# Patient Record
Sex: Male | Born: 1960
Health system: Southern US, Community
[De-identification: ages and names within clinical notes are randomized; demographics above are authoritative.]

## PROBLEM LIST (undated history)

## (undated) DIAGNOSIS — M1990 Unspecified osteoarthritis, unspecified site: Secondary | ICD-10-CM

## (undated) DIAGNOSIS — R0609 Other forms of dyspnea: Principal | ICD-10-CM

## (undated) DIAGNOSIS — I2510 Atherosclerotic heart disease of native coronary artery without angina pectoris: Secondary | ICD-10-CM

## (undated) DIAGNOSIS — E7800 Pure hypercholesterolemia, unspecified: Secondary | ICD-10-CM

## (undated) HISTORY — DX: Other forms of dyspnea: R06.09

## (undated) HISTORY — DX: Pure hypercholesterolemia, unspecified: E78.00

## (undated) HISTORY — DX: Atherosclerotic heart disease of native coronary artery without angina pectoris: I25.10

## (undated) HISTORY — DX: Unspecified osteoarthritis, unspecified site: M19.90

---

## 2001-01-02 ENCOUNTER — Encounter: Payer: Self-pay | Admitting: Family Medicine

## 2001-01-02 ENCOUNTER — Encounter: Admission: RE | Admit: 2001-01-02 | Discharge: 2001-01-02 | Payer: Self-pay | Admitting: Family Medicine

## 2001-01-22 HISTORY — PX: OTHER SURGICAL HISTORY: SHX169

## 2001-01-28 ENCOUNTER — Encounter: Payer: Self-pay | Admitting: Otolaryngology

## 2001-01-28 ENCOUNTER — Encounter: Admission: RE | Admit: 2001-01-28 | Discharge: 2001-01-28 | Payer: Self-pay | Admitting: Otolaryngology

## 2001-02-07 ENCOUNTER — Ambulatory Visit (HOSPITAL_BASED_OUTPATIENT_CLINIC_OR_DEPARTMENT_OTHER): Admission: RE | Admit: 2001-02-07 | Discharge: 2001-02-08 | Payer: Self-pay | Admitting: Otolaryngology

## 2001-02-27 ENCOUNTER — Ambulatory Visit (HOSPITAL_COMMUNITY): Admission: RE | Admit: 2001-02-27 | Discharge: 2001-02-27 | Payer: Self-pay | Admitting: Oncology

## 2001-02-27 ENCOUNTER — Encounter: Payer: Self-pay | Admitting: Oncology

## 2001-02-28 ENCOUNTER — Encounter: Payer: Self-pay | Admitting: Oncology

## 2001-02-28 ENCOUNTER — Ambulatory Visit (HOSPITAL_COMMUNITY): Admission: RE | Admit: 2001-02-28 | Discharge: 2001-02-28 | Payer: Self-pay | Admitting: Oncology

## 2001-06-04 ENCOUNTER — Encounter: Payer: Self-pay | Admitting: Oncology

## 2001-06-04 ENCOUNTER — Ambulatory Visit (HOSPITAL_COMMUNITY): Admission: RE | Admit: 2001-06-04 | Discharge: 2001-06-04 | Payer: Self-pay | Admitting: Oncology

## 2001-10-01 ENCOUNTER — Encounter: Payer: Self-pay | Admitting: Oncology

## 2001-10-01 ENCOUNTER — Ambulatory Visit: Admission: RE | Admit: 2001-10-01 | Discharge: 2001-10-01 | Payer: Self-pay | Admitting: Otolaryngology

## 2002-04-01 ENCOUNTER — Encounter: Payer: Self-pay | Admitting: Oncology

## 2002-04-01 ENCOUNTER — Ambulatory Visit (HOSPITAL_COMMUNITY): Admission: RE | Admit: 2002-04-01 | Discharge: 2002-04-01 | Payer: Self-pay | Admitting: Oncology

## 2002-09-23 ENCOUNTER — Ambulatory Visit (HOSPITAL_COMMUNITY): Admission: RE | Admit: 2002-09-23 | Discharge: 2002-09-23 | Payer: Self-pay | Admitting: Oncology

## 2002-09-23 ENCOUNTER — Encounter: Payer: Self-pay | Admitting: Oncology

## 2003-05-17 ENCOUNTER — Ambulatory Visit (HOSPITAL_COMMUNITY): Admission: RE | Admit: 2003-05-17 | Discharge: 2003-05-17 | Payer: Self-pay | Admitting: Oncology

## 2003-11-10 ENCOUNTER — Ambulatory Visit (HOSPITAL_COMMUNITY): Admission: RE | Admit: 2003-11-10 | Discharge: 2003-11-10 | Payer: Self-pay | Admitting: Oncology

## 2004-05-15 ENCOUNTER — Ambulatory Visit: Payer: Self-pay | Admitting: Oncology

## 2004-05-17 ENCOUNTER — Ambulatory Visit (HOSPITAL_COMMUNITY): Admission: RE | Admit: 2004-05-17 | Discharge: 2004-05-17 | Payer: Self-pay | Admitting: Oncology

## 2004-12-27 ENCOUNTER — Ambulatory Visit: Payer: Self-pay | Admitting: Oncology

## 2004-12-28 ENCOUNTER — Ambulatory Visit (HOSPITAL_COMMUNITY): Admission: RE | Admit: 2004-12-28 | Discharge: 2004-12-28 | Payer: Self-pay | Admitting: Oncology

## 2005-06-19 ENCOUNTER — Observation Stay (HOSPITAL_COMMUNITY): Admission: EM | Admit: 2005-06-19 | Discharge: 2005-06-19 | Payer: Self-pay | Admitting: Emergency Medicine

## 2005-12-17 ENCOUNTER — Ambulatory Visit: Payer: Self-pay | Admitting: Oncology

## 2005-12-20 LAB — CBC WITH DIFFERENTIAL/PLATELET
Eosinophils Absolute: 0.1 10*3/uL (ref 0.0–0.5)
HCT: 42.7 % (ref 38.7–49.9)
LYMPH%: 42.1 % (ref 14.0–48.0)
MCHC: 34.8 g/dL (ref 32.0–35.9)
MCV: 84.1 fL (ref 81.6–98.0)
MONO#: 0.4 10*3/uL (ref 0.1–0.9)
MONO%: 9.4 % (ref 0.0–13.0)
NEUT%: 46.5 % (ref 40.0–75.0)
Platelets: 169 10*3/uL (ref 145–400)
RBC: 5.07 10*6/uL (ref 4.20–5.71)
WBC: 4.7 10*3/uL (ref 4.0–10.0)

## 2005-12-20 LAB — COMPREHENSIVE METABOLIC PANEL
Alkaline Phosphatase: 45 U/L (ref 39–117)
CO2: 25 mEq/L (ref 19–32)
Creatinine, Ser: 1.05 mg/dL (ref 0.40–1.50)
Glucose, Bld: 92 mg/dL (ref 70–99)
Sodium: 139 mEq/L (ref 135–145)
Total Bilirubin: 0.8 mg/dL (ref 0.3–1.2)

## 2005-12-20 LAB — LACTATE DEHYDROGENASE: LDH: 121 U/L (ref 94–250)

## 2005-12-21 ENCOUNTER — Ambulatory Visit (HOSPITAL_COMMUNITY): Admission: RE | Admit: 2005-12-21 | Discharge: 2005-12-21 | Payer: Self-pay | Admitting: Oncology

## 2006-01-22 HISTORY — PX: CHOLECYSTECTOMY: SHX55

## 2010-02-11 ENCOUNTER — Encounter: Payer: Self-pay | Admitting: Oncology

## 2012-11-04 ENCOUNTER — Encounter (INDEPENDENT_AMBULATORY_CARE_PROVIDER_SITE_OTHER): Payer: Self-pay | Admitting: General Surgery

## 2012-11-04 ENCOUNTER — Encounter (INDEPENDENT_AMBULATORY_CARE_PROVIDER_SITE_OTHER): Payer: Self-pay

## 2012-11-04 ENCOUNTER — Other Ambulatory Visit (INDEPENDENT_AMBULATORY_CARE_PROVIDER_SITE_OTHER): Payer: Self-pay | Admitting: General Surgery

## 2012-11-04 ENCOUNTER — Ambulatory Visit (INDEPENDENT_AMBULATORY_CARE_PROVIDER_SITE_OTHER): Payer: BC Managed Care – PPO | Admitting: General Surgery

## 2012-11-04 VITALS — BP 124/82 | HR 77 | Temp 97.7°F | Resp 16 | Ht 71.0 in | Wt 235.4 lb

## 2012-11-04 DIAGNOSIS — L02419 Cutaneous abscess of limb, unspecified: Secondary | ICD-10-CM | POA: Insufficient documentation

## 2012-11-04 DIAGNOSIS — IMO0002 Reserved for concepts with insufficient information to code with codable children: Secondary | ICD-10-CM

## 2012-11-04 MED ORDER — HYDROCODONE-ACETAMINOPHEN 5-325 MG PO TABS: 1.0000 | ORAL_TABLET | Freq: Four times a day (QID) | ORAL | Status: DC | PRN

## 2012-11-04 NOTE — Patient Instructions (Signed)
Pull out packing in 48 hours.    Shower with warm soapy water running over it.    Pack daily for 1 week after packing comes out.    Follow up in 2-3 weeks.

## 2012-11-05 ENCOUNTER — Telehealth (INDEPENDENT_AMBULATORY_CARE_PROVIDER_SITE_OTHER): Payer: Self-pay

## 2012-11-05 ENCOUNTER — Telehealth (INDEPENDENT_AMBULATORY_CARE_PROVIDER_SITE_OTHER): Payer: Self-pay | Admitting: *Deleted

## 2012-11-05 ENCOUNTER — Other Ambulatory Visit (INDEPENDENT_AMBULATORY_CARE_PROVIDER_SITE_OTHER): Payer: Self-pay | Admitting: *Deleted

## 2012-11-05 MED ORDER — ONDANSETRON 4 MG PO TBDP: 4.0000 mg | ORAL_TABLET | Freq: Four times a day (QID) | ORAL | Status: DC | PRN

## 2012-11-05 NOTE — Progress Notes (Signed)
Patient ID: Garrett Keller, male   DOB: 03/31/1960, 52 y.o.   MRN: [id number]  Chief Complaint  Patient presents with  . Other    urge office/poss spider bite under Rt axilla    HPI Garrett Keller is a 52 y.o. male.   HPI Patient is a 52 year old male who presents with pain, redness, and swelling in her right axilla. He is concerned that he might have gotten a spider bite or some other form of insect bite. He denied ever and nose a insect or arthropod on him, but he does not recall any other incident of trauma. This has been going on for approximately 3 days. It first started hurting and then proceeded to become very red and swollen. It has gotten to where it hurts him to move his arm.  He denies fevers and chills.   Past Medical History  Diagnosis Date  . Arthritis     Past Surgical History  Procedure Laterality Date  . Cholecystectomy  2008  . 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 No pertinent family history.  Social History History  Substance Use Topics  . Smoking status: Never Smoker   . Smokeless tobacco: Not on file  . Alcohol Use: Yes     Comment: weekeneds    No Known Allergies  Current Outpatient Prescriptions  Medication Sig Dispense Refill  . HYDROcodone-acetaminophen (NORCO/VICODIN) 5-325 MG per tablet       . omeprazole (PRILOSEC) 20 MG capsule       . sulfamethoxazole-trimethoprim (BACTRIM DS) 800-160 MG per tablet       . HYDROcodone-acetaminophen (NORCO) 5-325 MG per tablet Take 1-2 tablets by mouth every 6 (six) hours as needed for pain.  20 tablet  0  . LORazepam (ATIVAN) 0.5 MG tablet       . ondansetron (ZOFRAN ODT) 4 MG disintegrating tablet Take 1 tablet (4 mg total) by mouth every 6 (six) hours as needed for nausea.  15 tablet  0   No current facility-administered medications for this visit.    Review of Systems Review of Systems  Blood pressure 124/82, pulse 77, temperature 97.7 F (36.5 C), temperature  source Temporal, resp. rate 16, height 5\' 11"  (1.803 m), weight 235 lb 6.4 oz (106.777 kg), SpO2 98.00%.  Physical Exam Physical Exam  Constitutional: He is oriented to person, place, and time. He appears well-developed and well-nourished. No distress.  HENT:  Head: Normocephalic and atraumatic.  Eyes: Conjunctivae are normal. Pupils are equal, round, and reactive to light. No scleral icterus.  Neck: Normal range of motion.  Cardiovascular: Normal rate and regular rhythm.   Pulmonary/Chest: Effort normal. No respiratory distress.  Abdominal: Soft. He exhibits no distension. There is no tenderness.  Neurological: He is alert and oriented to person, place, and time.  Skin: Skin is warm and dry. No rash noted. He is not diaphoretic. No erythema. No pallor.  Right axillary abscess posterior axillary line.    Psychiatric: He has a normal mood and affect. His behavior is normal. Judgment and thought content normal.    Data Reviewed No results found. n/a  Assessment/plan    Axillary abscess The patient had an axillary abscess of unknown etiology. An incision and drainage was performed at the bedside. He is instructed to remove packing in 48 hours and then we will set up home health to teach his wife how to pack it for an additional week.  I  will see him back in approximately 2 weeks.          Garrett Keller 11/05/2012, 9:42 AM

## 2012-11-05 NOTE — Telephone Encounter (Signed)
Patient has called back again to report that there is still a lump at the wound site.  Explained that this is probably just the packing which needs to stay in 48 hours however patient disagrees and thinks it needs to be drained more.  Spoke to Dr. Donell Beers and Cyndra Numbers who asked that appt for patient be made in urgent office tomorrow where packing can be removed and wound reassessed.  Patient is agreeable at this time and states understanding.

## 2012-11-05 NOTE — Telephone Encounter (Signed)
Patient called in this morning reporting episodes of vomiting and nausea.  Patient states he feels like it is the pain medication causing his stomach to be upset.  Per protocol Zofran 4mg  ODT Take 1 tablet every 6 hours as needed for nausea #15 no refills escribed to patient's pharmacy and confirmation received.  Explained to patient that if this doesn't help then to give [place] a call back and we can move to the Phenergan.  Patient states understanding and agreeable at this time.

## 2012-11-05 NOTE — Assessment & Plan Note (Signed)
The patient had an axillary abscess of unknown etiology. An incision and drainage was performed at the bedside. He is instructed to remove packing in 48 hours and then we will set up home health to teach his wife how to pack it for an additional week.  I will see him back in approximately 2 weeks.

## 2012-11-05 NOTE — Telephone Encounter (Signed)
Pt called stating [facility] called and advised pt he has not met his insurance deductable. [facility] will charge 150$ to come to home to teach pt wd care. Pt requests ov here to have wd care teaching for wife. Pt advised I will send msg to [facility] and Dr Donell Beers to see if order can be changed for office wd care. Pt advised office will return his call.

## 2012-11-06 ENCOUNTER — Ambulatory Visit (INDEPENDENT_AMBULATORY_CARE_PROVIDER_SITE_OTHER): Payer: BC Managed Care – PPO | Admitting: General Surgery

## 2012-11-06 ENCOUNTER — Encounter (INDEPENDENT_AMBULATORY_CARE_PROVIDER_SITE_OTHER): Payer: Self-pay

## 2012-11-06 ENCOUNTER — Encounter (INDEPENDENT_AMBULATORY_CARE_PROVIDER_SITE_OTHER): Payer: Self-pay | Admitting: General Surgery

## 2012-11-06 VITALS — BP 120/78 | HR 80 | Resp 18 | Ht 71.0 in | Wt 233.0 lb

## 2012-11-06 DIAGNOSIS — IMO0002 Reserved for concepts with insufficient information to code with codable children: Secondary | ICD-10-CM

## 2012-11-06 DIAGNOSIS — L02419 Cutaneous abscess of limb, unspecified: Secondary | ICD-10-CM

## 2012-11-06 MED ORDER — OXYCODONE HCL 5 MG PO TABS: 5.0000 mg | ORAL_TABLET | ORAL | Status: DC | PRN

## 2012-11-06 NOTE — Patient Instructions (Addendum)
Shower before your appointment tomorrow. Take 2 pain pills one hour before your appointment tomorrow.  No heavy activity with right arm for 10 days.

## 2012-11-06 NOTE — Progress Notes (Signed)
Subjective:     Patient ID: ROCKLAND KOTARSKI, male   DOB: Feb 13, 1960, 52 y.o.   MRN: [id number]  HPI  He had an incision and drainage of a posterior right axillary abscess yesterday. He tried to remove the packing in the shower today but said he was in exquisite pain and could not do it. He states he's not feeling significantly better. He has been taking the Bactrim. He states there is an area extending around the back which has not changed and is painful. He's trying to take hydrocodone but this is not helping with the pain.   Review of SystemsNo fever or chills.     Objective:   Physical Exam Gen.-he is in no acute distress.  Right axilla-packing was removed and there is an open wound. Extending around the back however is an area of erythema and induration. I anesthetized the wound before removing the packing and an anesthetized the area tracking around the back. I then extended the incision and discovered another pocket of infection which was loculated and I broke this up. I excised in elliptical-shaped wedge of skin and debrided some necrotic tissue. I then repacked with saline moistened gauze followed by dry dressing.    Assessment:     Complex right axillary abscess. More extensive incision and drainage was done today.    Plan:     Change pain medication to oxycodone. Return visit tomorrow for dressing change and wound check.

## 2012-11-07 ENCOUNTER — Ambulatory Visit (INDEPENDENT_AMBULATORY_CARE_PROVIDER_SITE_OTHER): Payer: BC Managed Care – PPO | Admitting: General Surgery

## 2012-11-07 ENCOUNTER — Encounter (INDEPENDENT_AMBULATORY_CARE_PROVIDER_SITE_OTHER): Payer: Self-pay | Admitting: General Surgery

## 2012-11-07 VITALS — BP 122/78 | HR 76 | Temp 97.0°F | Resp 15 | Ht 71.0 in | Wt 232.8 lb

## 2012-11-07 DIAGNOSIS — Z4889 Encounter for other specified surgical aftercare: Secondary | ICD-10-CM

## 2012-11-07 LAB — WOUND CULTURE
Gram Stain: NONE SEEN
Gram Stain: NONE SEEN

## 2012-11-07 NOTE — Progress Notes (Signed)
He is here for wound check and dressing change. He feels a lot better and slept well last night.  He is afebrile. The packing was removed through the right axillary wound is clean. It was repacked.  Will bring him back on Monday for a nurse dressing change. He was instructed to remove the bandage on Sunday in place a dry gauze over it. The culture is MRSA positive and we discussed what this means

## 2012-11-07 NOTE — Patient Instructions (Signed)
You have a MRSA infection.  You're on the appropriate antibiotic for this.  Clean hands frequently with hand sanitizer.  Remove packing Sunday afternoon or evening and apply a dry dressing to the area. Come for a dressing change on Monday.

## 2012-11-10 ENCOUNTER — Ambulatory Visit (INDEPENDENT_AMBULATORY_CARE_PROVIDER_SITE_OTHER): Payer: BC Managed Care – PPO

## 2012-11-10 VITALS — BP 122/80 | HR 86 | Temp 98.1°F | Resp 18 | Ht 71.0 in | Wt 232.0 lb

## 2012-11-10 DIAGNOSIS — Z4800 Encounter for change or removal of nonsurgical wound dressing: Secondary | ICD-10-CM

## 2012-11-10 NOTE — Patient Instructions (Signed)
You may shower tomorrow AM and remove packing before your appt with our office.

## 2012-11-10 NOTE — Progress Notes (Signed)
Patient came in today for repacking of his right axilla wound.  Pt denies any fever chills.  Did complain of some itching which I stated was part of the healing process.  Wound looked good, beefy red tissue with a minimal amount of purulent drainage, with a slight odor.  Pt states the pain is getting better and he has been taking his prescribed pain medication.  I cleansed wound with NS and then repacking with a NS gauze and covered with a dry gauze.  Paper tape for sensitive skin that pt brought from home was then applied.  Pt did have several blister like areas as well as a few scabs where medipore tape was previous applied and he had a reaction to it.

## 2012-11-11 ENCOUNTER — Telehealth (INDEPENDENT_AMBULATORY_CARE_PROVIDER_SITE_OTHER): Payer: Self-pay | Admitting: *Deleted

## 2012-11-11 ENCOUNTER — Ambulatory Visit (INDEPENDENT_AMBULATORY_CARE_PROVIDER_SITE_OTHER): Payer: BC Managed Care – PPO

## 2012-11-11 DIAGNOSIS — Z4800 Encounter for change or removal of nonsurgical wound dressing: Secondary | ICD-10-CM

## 2012-11-11 NOTE — Telephone Encounter (Signed)
Patient called to ask about having another wound culture done to see if the infection has cleared up following antibiotic treatment.  Explained that we don't usually do a wound culture on the end of the antibiotics unless there is a reason to do so.  Explained that I would send a message however to ask then we can let the patient know.  Patient states understanding.

## 2012-11-11 NOTE — Progress Notes (Signed)
Pt comes in today for repacking/dressing change 7 days s/p right axillary abscess.  I removed the existing packing and examined the wound.  The wound looks good with good grandular tissue.  I then repacked with wet to dry gauze.  The pt had blisters about 3-4 inches from the wound which he states came from the cloth tape.  Pt brought his own tape, and I secured the dry gauze with that.

## 2012-11-11 NOTE — Telephone Encounter (Signed)
Repeat culture not needed.

## 2012-11-12 ENCOUNTER — Encounter (INDEPENDENT_AMBULATORY_CARE_PROVIDER_SITE_OTHER): Payer: Self-pay

## 2012-11-12 ENCOUNTER — Ambulatory Visit (INDEPENDENT_AMBULATORY_CARE_PROVIDER_SITE_OTHER): Payer: BC Managed Care – PPO | Admitting: General Surgery

## 2012-11-12 VITALS — BP 162/98 | HR 76 | Temp 97.2°F | Resp 16 | Ht 71.0 in | Wt 232.0 lb

## 2012-11-12 DIAGNOSIS — T148XXA Other injury of unspecified body region, initial encounter: Secondary | ICD-10-CM

## 2012-11-12 NOTE — Progress Notes (Signed)
Patient comes in today for a packing change s/p right axillary wound.  The wound is still large in length however once the packing was removed the wound had good granulation tissue with no drainage.  The wound seems to be getting shallower and less sensitive for the patient.  He has no c/o redness, fevers, chills, N/V.  He is still taking the percocet as needed but has started to limit the amount used.  I irrigated the wound with normal Saline and then repacked the wound with a sterile wet to dry 4x4 gauze.  The patient tolerated this well.  It was then covered with sterile gauze and taped using paper tape that the patient brought in.  He has a nurse only appt tomorrow.

## 2012-11-13 ENCOUNTER — Ambulatory Visit (INDEPENDENT_AMBULATORY_CARE_PROVIDER_SITE_OTHER): Payer: BC Managed Care – PPO

## 2012-11-13 DIAGNOSIS — Z4801 Encounter for change or removal of surgical wound dressing: Secondary | ICD-10-CM

## 2012-11-13 NOTE — Progress Notes (Signed)
Pt is here today for wound dressing change.  Wound is healing nicely with good granulation tissue.  Pt pulled the packing out before he came for his appointment.  Wound repacked with damp saline gauze and covered with dry 4 x 4 dressing and paper tape.  The patient would like to know if he can go back to work on Monday.  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  Will ask Dr. Abbey Chatters if pt can return to work and let him know at his appointment tomorrow morning at 8:30a.m.

## 2012-11-14 ENCOUNTER — Ambulatory Visit (INDEPENDENT_AMBULATORY_CARE_PROVIDER_SITE_OTHER): Payer: BC Managed Care – PPO

## 2012-11-14 ENCOUNTER — Encounter (INDEPENDENT_AMBULATORY_CARE_PROVIDER_SITE_OTHER): Payer: Self-pay

## 2012-11-14 DIAGNOSIS — L02419 Cutaneous abscess of limb, unspecified: Secondary | ICD-10-CM

## 2012-11-14 DIAGNOSIS — IMO0002 Reserved for concepts with insufficient information to code with codable children: Secondary | ICD-10-CM

## 2012-11-14 NOTE — Progress Notes (Signed)
Pt came in for nurse only to have dressing change to axillary wound. The pt had already removed the packing this a.m. Before coming to the appt and he removed the bandage in the room. I cleaned the wound with normal saline and a q-tip before repacking the wound with a 2x2 gauze wet to dry with normal saline. I recovered the area with 4x4 gauze and tape. The pt will need to return on Monday for a nurse visit per Dr Abbey Chatters.

## 2012-11-17 ENCOUNTER — Ambulatory Visit (INDEPENDENT_AMBULATORY_CARE_PROVIDER_SITE_OTHER): Payer: BC Managed Care – PPO

## 2012-11-17 VITALS — BP 124/84 | Temp 97.4°F | Resp 16 | Ht 71.0 in | Wt 234.6 lb

## 2012-11-17 DIAGNOSIS — Z4801 Encounter for change or removal of surgical wound dressing: Secondary | ICD-10-CM

## 2012-11-17 NOTE — Progress Notes (Signed)
Patient comes to office today for right axilla-wound check and packing.  Gauze was removed from  open axilla wound. I then repacked with saline moistened gauze followed by dry dressing. Patient tolerated well.  Wound appears to be healing well.  Patient denies having any fevers.  Patient reports continuous pain in the right axilla.  Skin around open wound has some mild irritation that patient feel's comes from the tape he was previously applying.  Patient has changed to sensitive skin tape.  Patient has nurse visit for tomorrow @ 3:00pm for dressing change and wound repacking.

## 2012-11-18 ENCOUNTER — Encounter (INDEPENDENT_AMBULATORY_CARE_PROVIDER_SITE_OTHER): Payer: Self-pay | Admitting: General Surgery

## 2012-11-18 ENCOUNTER — Ambulatory Visit (INDEPENDENT_AMBULATORY_CARE_PROVIDER_SITE_OTHER): Payer: BC Managed Care – PPO | Admitting: General Surgery

## 2012-11-18 VITALS — BP 122/81 | HR 66 | Temp 97.0°F | Resp 14 | Ht 71.0 in | Wt 233.6 lb

## 2012-11-18 DIAGNOSIS — Z5189 Encounter for other specified aftercare: Secondary | ICD-10-CM

## 2012-11-18 NOTE — Patient Instructions (Signed)
Patient came in for dressing change. I placed a wet 2 x 2 gauze inside the wound and placed a dry gauze on top of the wound. I told the patient that he can take a shower and let the water and soap over the wound and he can put another wet gauze in the wound and place a dry gauze on top. I told him that he will need not to put some much tape on the gauze and on his skin, the tape is eating up his skin. I made a apt for the patient to come back tomorrow for a nurse only at 3

## 2012-11-19 ENCOUNTER — Ambulatory Visit (INDEPENDENT_AMBULATORY_CARE_PROVIDER_SITE_OTHER): Payer: BC Managed Care – PPO

## 2012-11-19 DIAGNOSIS — Z4801 Encounter for change or removal of surgical wound dressing: Secondary | ICD-10-CM

## 2012-11-19 NOTE — Progress Notes (Signed)
Pt came in for nurse only to have packing changed. The packing was removed already by the pt before coming to the office today. The wound was repacked with a 2x2 gauze wet to dry with gauze placed over top of the area. The pt tolerated this well. The pt will come back tomorrow for nurse only visit.

## 2012-11-20 ENCOUNTER — Ambulatory Visit (INDEPENDENT_AMBULATORY_CARE_PROVIDER_SITE_OTHER): Payer: BC Managed Care – PPO | Admitting: General Surgery

## 2012-11-20 ENCOUNTER — Encounter (INDEPENDENT_AMBULATORY_CARE_PROVIDER_SITE_OTHER): Payer: Self-pay | Admitting: General Surgery

## 2012-11-20 VITALS — BP 122/81 | HR 76 | Temp 97.0°F | Resp 15 | Ht 71.0 in | Wt 232.0 lb

## 2012-11-20 DIAGNOSIS — Z4800 Encounter for change or removal of nonsurgical wound dressing: Secondary | ICD-10-CM

## 2012-11-20 NOTE — Patient Instructions (Signed)
Patient came in today for a nurse only visit for reck of axilla wound. I looked at the wound and the axilla wound is closing up well. I placed a wet 2 x 2 gauze inside the wound and place a dry guaze over the wound and placed paper tape over the wound. I told the patient that he could stand in front of a mirror and placed a wet 2 x 2 gauze inside the wound and placed a dry gauze on top that he did not need to come to the office for nurse visit that the wound was almost healed, patient agree to do that put I made him aware that he will need to keep his apt with Dr Abbey Chatters next week 11-27-2012. The patient want a Rx for pain med. Oxycodone 5 mg 1 tab po q 6 hrs prn for pain. I got Dr Ezzard Standing to write it for me, he was the urgent office doctor today.

## 2012-11-24 ENCOUNTER — Encounter (INDEPENDENT_AMBULATORY_CARE_PROVIDER_SITE_OTHER): Payer: BC Managed Care – PPO | Admitting: General Surgery

## 2012-11-27 ENCOUNTER — Encounter (INDEPENDENT_AMBULATORY_CARE_PROVIDER_SITE_OTHER): Payer: Self-pay | Admitting: General Surgery

## 2012-11-27 ENCOUNTER — Ambulatory Visit (INDEPENDENT_AMBULATORY_CARE_PROVIDER_SITE_OTHER): Payer: BC Managed Care – PPO | Admitting: General Surgery

## 2012-11-27 VITALS — BP 120/90 | HR 76 | Temp 97.3°F | Resp 16 | Ht 71.0 in | Wt 231.0 lb

## 2012-11-27 DIAGNOSIS — IMO0002 Reserved for concepts with insufficient information to code with codable children: Secondary | ICD-10-CM

## 2012-11-27 DIAGNOSIS — L02419 Cutaneous abscess of limb, unspecified: Secondary | ICD-10-CM

## 2012-11-27 NOTE — Patient Instructions (Signed)
Continue current dressing changes

## 2012-11-27 NOTE — Progress Notes (Signed)
Subjective:     Patient ID: Garrett Keller, male   DOB: 19-Nov-1960, 52 y.o.   MRN: [id number]  HPI  He is here for a wound check following complex incision and drainage of a right axillary abscess. He is doing his own dressing changes. These are wet to dry dressing changes. He is back at work.   Review of Systems     Objective:   Physical Exam Right axilla-wound is clean and healing in. Good granulation tissue is present. No purulence. No erythema.    Assessment:     Abscess wound healing well by secondary intention and right axilla.     Plan:     Continue current wound care. Return visit in 4 weeks.

## 2012-12-25 ENCOUNTER — Encounter (INDEPENDENT_AMBULATORY_CARE_PROVIDER_SITE_OTHER): Payer: BC Managed Care – PPO | Admitting: General Surgery

## 2012-12-30 ENCOUNTER — Encounter (INDEPENDENT_AMBULATORY_CARE_PROVIDER_SITE_OTHER): Payer: Self-pay

## 2012-12-30 ENCOUNTER — Ambulatory Visit (INDEPENDENT_AMBULATORY_CARE_PROVIDER_SITE_OTHER): Payer: BC Managed Care – PPO | Admitting: General Surgery

## 2012-12-30 VITALS — BP 136/70 | HR 64 | Temp 98.0°F | Resp 18 | Ht 69.0 in | Wt 235.0 lb

## 2012-12-30 DIAGNOSIS — L02419 Cutaneous abscess of limb, unspecified: Secondary | ICD-10-CM

## 2012-12-30 DIAGNOSIS — IMO0002 Reserved for concepts with insufficient information to code with codable children: Secondary | ICD-10-CM

## 2012-12-30 NOTE — Patient Instructions (Signed)
Buy Hibiclens soap and wash your body with it in the shower daily for one week.  Have any close contacts do the same.  Wash clothes in hot water.  Spray furniture with anti-Staph spray.

## 2012-12-30 NOTE — Progress Notes (Signed)
Subjective:     Patient ID: Garrett Keller, male   DOB: [birth year], 52 y.o.   MRN: [id number]  HPI  He is here for a wound check following complex incision and drainage of a right axillary abscess.   His wound has healed and he is having no pain.  Review of Systems     Objective:   Physical Exam Right axilla-wound is healed.    Assessment:     MRSA right axillary abscess status post incision and drainage-wound has healed by secondary intention.    Plan:     Hibiclens body wash daily for one week in the shower. Clean clothes with hot water. Spray furniture with anti-staph spray. Return visit when necessary.

## 2013-11-23 ENCOUNTER — Other Ambulatory Visit: Payer: Self-pay

## 2014-01-28 ENCOUNTER — Other Ambulatory Visit: Payer: Self-pay | Admitting: Dermatology

## 2015-03-14 DIAGNOSIS — N401 Enlarged prostate with lower urinary tract symptoms: Secondary | ICD-10-CM | POA: Insufficient documentation

## 2015-03-14 DIAGNOSIS — R972 Elevated prostate specific antigen [PSA]: Secondary | ICD-10-CM | POA: Insufficient documentation

## 2015-10-10 DIAGNOSIS — R454 Irritability and anger: Secondary | ICD-10-CM | POA: Diagnosis not present

## 2015-11-21 DIAGNOSIS — M545 Low back pain: Secondary | ICD-10-CM | POA: Diagnosis not present

## 2015-11-21 DIAGNOSIS — K219 Gastro-esophageal reflux disease without esophagitis: Secondary | ICD-10-CM | POA: Diagnosis not present

## 2015-11-21 DIAGNOSIS — F329 Major depressive disorder, single episode, unspecified: Secondary | ICD-10-CM | POA: Diagnosis not present

## 2015-11-29 DIAGNOSIS — K119 Disease of salivary gland, unspecified: Secondary | ICD-10-CM | POA: Diagnosis not present

## 2015-12-23 DIAGNOSIS — K118 Other diseases of salivary glands: Secondary | ICD-10-CM | POA: Insufficient documentation

## 2015-12-23 DIAGNOSIS — K119 Disease of salivary gland, unspecified: Secondary | ICD-10-CM | POA: Diagnosis not present

## 2016-02-01 ENCOUNTER — Other Ambulatory Visit: Payer: Self-pay | Admitting: Otolaryngology

## 2016-02-01 DIAGNOSIS — D37030 Neoplasm of uncertain behavior of the parotid salivary glands: Secondary | ICD-10-CM | POA: Diagnosis not present

## 2016-02-01 DIAGNOSIS — C8581 Other specified types of non-Hodgkin lymphoma, lymph nodes of head, face, and neck: Secondary | ICD-10-CM | POA: Diagnosis not present

## 2016-02-01 DIAGNOSIS — C884 Extranodal marginal zone B-cell lymphoma of mucosa-associated lymphoid tissue [MALT-lymphoma]: Secondary | ICD-10-CM | POA: Diagnosis not present

## 2016-02-01 DIAGNOSIS — D72820 Lymphocytosis (symptomatic): Secondary | ICD-10-CM | POA: Diagnosis not present

## 2016-02-01 DIAGNOSIS — K119 Disease of salivary gland, unspecified: Secondary | ICD-10-CM | POA: Diagnosis not present

## 2016-02-02 ENCOUNTER — Other Ambulatory Visit (HOSPITAL_COMMUNITY)
Admission: RE | Admit: 2016-02-02 | Disposition: A | Discharge status: Home / Self Care | Payer: Self-pay | Source: Ambulatory Visit | Attending: Otolaryngology | Admitting: Otolaryngology

## 2016-02-13 DIAGNOSIS — C884 Extranodal marginal zone B-cell lymphoma of mucosa-associated lymphoid tissue [MALT-lymphoma]: Secondary | ICD-10-CM | POA: Insufficient documentation

## 2016-02-17 ENCOUNTER — Other Ambulatory Visit (HOSPITAL_COMMUNITY): Payer: Self-pay | Admitting: Otolaryngology

## 2016-02-17 DIAGNOSIS — C884 Extranodal marginal zone B-cell lymphoma of mucosa-associated lymphoid tissue [MALT-lymphoma]: Secondary | ICD-10-CM

## 2016-02-24 ENCOUNTER — Encounter (HOSPITAL_COMMUNITY)
Admission: RE | Admit: 2016-02-24 | Discharge: 2016-02-24 | Disposition: A | Discharge status: Home / Self Care | Payer: BLUE CROSS/BLUE SHIELD | Source: Ambulatory Visit | Attending: Otolaryngology | Admitting: Otolaryngology

## 2016-02-24 DIAGNOSIS — C884 Extranodal marginal zone B-cell lymphoma of mucosa-associated lymphoid tissue [MALT-lymphoma]: Secondary | ICD-10-CM | POA: Insufficient documentation

## 2016-02-24 LAB — GLUCOSE, CAPILLARY: Glucose-Capillary: 105 mg/dL — ABNORMAL HIGH (ref 65–99)

## 2016-02-24 MED ORDER — FLUDEOXYGLUCOSE F - 18 (FDG) INJECTION
11.4200 | Freq: Once | INTRAVENOUS | Status: AC | PRN
  Administered 2016-02-24: 11.42 via INTRAVENOUS

## 2016-03-01 ENCOUNTER — Other Ambulatory Visit: Payer: Self-pay

## 2016-03-01 DIAGNOSIS — D492 Neoplasm of unspecified behavior of bone, soft tissue, and skin: Secondary | ICD-10-CM | POA: Diagnosis not present

## 2016-03-01 DIAGNOSIS — D485 Neoplasm of uncertain behavior of skin: Secondary | ICD-10-CM | POA: Diagnosis not present

## 2016-03-05 ENCOUNTER — Telehealth: Payer: Self-pay | Admitting: Hematology

## 2016-03-05 NOTE — Telephone Encounter (Signed)
Appt has been scheduled for the pt to see Dr. Irene Limbo on 2/16 at 1230pm. Aware to arrive 30 minutes early. Pt agreed to the appt date and time. Demographics verified.

## 2016-03-09 ENCOUNTER — Telehealth: Payer: Self-pay | Admitting: Hematology

## 2016-03-09 ENCOUNTER — Ambulatory Visit (HOSPITAL_BASED_OUTPATIENT_CLINIC_OR_DEPARTMENT_OTHER): Payer: BLUE CROSS/BLUE SHIELD | Admitting: Hematology

## 2016-03-09 ENCOUNTER — Encounter: Payer: Self-pay | Admitting: Hematology

## 2016-03-09 ENCOUNTER — Ambulatory Visit (HOSPITAL_BASED_OUTPATIENT_CLINIC_OR_DEPARTMENT_OTHER): Payer: BLUE CROSS/BLUE SHIELD

## 2016-03-09 VITALS — BP 154/81 | HR 70 | Temp 98.3°F | Resp 18 | Ht 70.0 in | Wt 235.4 lb

## 2016-03-09 DIAGNOSIS — C884 Extranodal marginal zone B-cell lymphoma of mucosa-associated lymphoid tissue [MALT-lymphoma]: Secondary | ICD-10-CM | POA: Diagnosis not present

## 2016-03-09 LAB — COMPREHENSIVE METABOLIC PANEL
ALT: 35 U/L (ref 0–55)
AST: 21 U/L (ref 5–34)
Albumin: 4 g/dL (ref 3.5–5.0)
Alkaline Phosphatase: 56 U/L (ref 40–150)
Anion Gap: 10 mEq/L (ref 3–11)
BUN: 17.2 mg/dL (ref 7.0–26.0)
CO2: 22 mEq/L (ref 22–29)
Calcium: 9.5 mg/dL (ref 8.4–10.4)
Chloride: 109 mEq/L (ref 98–109)
Creatinine: 0.9 mg/dL (ref 0.7–1.3)
EGFR: >90 mL/min/{1.73_m2} (ref >90)
GLUCOSE: 99 mg/dL (ref 70–140)
POTASSIUM: 3.6 meq/L (ref 3.5–5.1)
Sodium: 141 mEq/L (ref 136–145)
Total Bilirubin: 0.66 mg/dL (ref 0.20–1.20)
Total Protein: 8.1 g/dL (ref 6.4–8.3)

## 2016-03-09 LAB — CBC & DIFF AND RETIC
BASO%: 0.2 % (ref 0.0–2.0)
Basophils Absolute: 0 10*3/uL (ref 0.0–0.1)
EOS ABS: 0.1 10*3/uL (ref 0.0–0.5)
EOS%: 2.1 % (ref 0.0–7.0)
HCT: 42.3 % (ref 38.4–49.9)
HGB: 14.8 g/dL (ref 13.0–17.1)
Immature Retic Fract: 2.2 % — ABNORMAL LOW (ref 3.00–10.60)
LYMPH%: 42.1 % (ref 14.0–49.0)
MCH: 30 pg (ref 27.2–33.4)
MCHC: 35 g/dL (ref 32.0–36.0)
MCV: 85.6 fL (ref 79.3–98.0)
MONO#: 0.4 10*3/uL (ref 0.1–0.9)
MONO%: 8.6 % (ref 0.0–14.0)
NEUT%: 47 % (ref 39.0–75.0)
NEUTROS ABS: 2 10*3/uL (ref 1.5–6.5)
Platelets: 150 10*3/uL (ref 140–400)
RBC: 4.94 10*6/uL (ref 4.20–5.82)
RDW: 13.3 % (ref 11.0–14.6)
RETIC %: 1.04 % (ref 0.80–1.80)
Retic Ct Abs: 51.38 10*3/uL (ref 34.80–93.90)
WBC: 4.2 10*3/uL (ref 4.0–10.3)
lymph#: 1.8 10*3/uL (ref 0.9–3.3)

## 2016-03-09 LAB — LACTATE DEHYDROGENASE: LDH: 158 U/L (ref 125–245)

## 2016-03-09 NOTE — Telephone Encounter (Signed)
Appointments scheduled per 2/16 LOS. Patient given AVS report and calendars with future scheduled appointments. °

## 2016-03-09 NOTE — Progress Notes (Signed)
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  HISTORY OF PRESENTING ILLNESS:  Garrett Keller is a wonderful 56 y.o. 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  Patient reports that he had a right parotid mass excised in 2003 but that there was no definitive pathologic diagnosis. He was followed by Dr. Eston Esters from oncology with interval scans but had no new concerns until 2007.  Patient recently presented with a fullness in the left side of the face with a small palpable mass possibly present for several months but was noticed about 2 months ago. He was evaluated by Dr. Constance Holster from ENT on 12/23/2015    He subsequently had a resection of his left parotid mass on 02/01/2016 which showed a MALT lymphoma of the left parotid gland. Patient subsequently had a PET/CT scan on 02/24/2016 which showed Diffuse hypermetabolism throughout [facility] ring which demonstrates diffuse soft tissue thickening (left greater than right). Small adjacent bilateral level IIa lymph nodes demonstrate hypermetabolism, as above. In addition, there is a focal area of soft tissue prominence in the inferior aspect of the left parotid gland immediately inferior to the left external auditory canal extending to the skin surface in the inferior aspect of the left year which demonstrates hypermetabolism. This may represent an additional focus of disease. No evidence of FDG avid lymphadenopathy in the chest or abdomen or focal skeletal lesions .  Patient notes that he has healed well from surgery . He has no specific focal symptoms . No fevers no chills no night sweats no unexpected weight loss . He  overall feels quite well .  MEDICAL HISTORY:  Past Medical History:  Diagnosis Date  . Arthritis   Rt parotid mass excised in 2003 - patient notes there was no definitive diagnosis. Patient was followed by Dr. Truddie Coco until 2007. 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  SURGICAL HISTORY: Past Surgical History:  Procedure Laterality Date  . CHOLECYSTECTOMY  2008  . salvary gland  2003   large gland inside of cheek    SOCIAL HISTORY: Social History   Social History  . Marital status: Married    Spouse name: N/A  . Number of children: N/A  . Years of education: N/A   Occupational History  . Not on file.   Social History Main Topics  . Smoking status: Never Smoker  . Smokeless tobacco: Never Used  . Alcohol use Yes     Comment: weekeneds  . Drug use: No  . Sexual activity: Not on file   Other Topics Concern  . Not on file   Social History Narrative  . No narrative on file  Has been a smoker but has had a history of exposure to secondhand smoke . 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 ALLERGIES:  is allergic to adhesive [tape].  MEDICATIONS:  Current Outpatient Prescriptions  Medication Sig Dispense Refill  .  LORazepam (ATIVAN) 0.5 MG tablet     . omeprazole (PRILOSEC) 20 MG capsule      No current facility-administered medications for this visit.     REVIEW OF SYSTEMS:    10 Point review of Systems was done is negative except as noted above.  PHYSICAL EXAMINATION: ECOG PERFORMANCE STATUS: 0 - Asymptomatic  . Vitals:   03/09/16 1216  BP: (!) 154/81  Pulse: 70  Resp: 18  Temp: 98.3 F (36.8 C)   Filed Weights   03/09/16 1216  Weight: 235 lb  6.4 oz (106.8 kg)   .Body mass index is 33.78 kg/m.  GENERAL:alert, in no acute distress and comfortable SKIN: skin color, texture, turgor are normal, no rashes or significant lesions EYES: normal, conjunctiva are pink and non-injected, sclera clear OROPHARYNX:no exudate, no erythema and lips, buccal mucosa, and tongue normal  NECK: supple, no JVD,  Healed surgical area over left parotid            no palpable lymphadenopathy in the cervical, axillary or inguinal LUNGS: clear to auscultation with normal respiratory effort HEART: regular rate & rhythm,  no murmurs and no lower extremity edema ABDOMEN: abdomen soft, non-tender, normoactive bowel sounds , no palpable hepato-splenomegaly Musculoskeletal: no cyanosis of digits and no clubbing  PSYCH: alert & oriented x 3 with fluent speech NEURO: no focal motor/sensory deficits  LABORATORY DATA:  I have reviewed the data as listed  . CBC Latest Ref Rng & Units 03/09/2016 12/20/2005  WBC 4.0 - 10.3 10e3/uL 4.2 4.7  Hemoglobin 13.0 - 17.1 g/dL 14.8 14.8  Hematocrit 38.4 - 49.9 % 42.3 42.7  Platelets 140 - 400 10e3/uL 150 169    . CMP Latest Ref Rng & Units 03/09/2016 12/20/2005  Glucose 70 - 140 mg/dl 99 92  BUN 7.0 - 26.0 mg/dL 17.2 19  Creatinine 0.7 - 1.3 mg/dL 0.9 1.05  Sodium 136 - 145 mEq/L 141 139  Potassium 3.5 - 5.1 mEq/L 3.6 3.6  Chloride 96 - 112 mEq/L - 106  CO2 22 - 29 mEq/L 22 25  Calcium 8.4 - 10.4 mg/dL 9.5 9.0  Total Protein 6.4 - 8.3 g/dL 8.1 7.8  Total Bilirubin 0.20 - 1.20 mg/dL 0.66 0.8  Alkaline Phos 40 - 150 U/L 56 45  AST 5 - 34 U/L 21 20  ALT 0 - 55 U/L 35 33   Component     Latest Ref Rng & Units 03/09/2016  LDH     125 - 245 U/L 158  Hep C Virus Ab     0.0 - 0.9 s/co ratio <0.1  Hepatitis B Surface Ag     Negative Negative  Hep B Core Ab, Tot     Negative Negative       RADIOGRAPHIC STUDIES: I have personally reviewed the radiological images as listed and agreed with the findings in the  report. Nm Pet Image Initial (pi) Skull Base To Thigh  Result Date: 02/24/2016 CLINICAL DATA:  Initial treatment strategy for MALT lymphoma. EXAM: NUCLEAR MEDICINE PET SKULL BASE TO THIGH TECHNIQUE: 11.42 mCi F-18 FDG was injected intravenously. Full-ring PET imaging was performed from the skull base to thigh after the radiotracer. CT data was obtained and used for attenuation correction and anatomic localization. FASTING BLOOD GLUCOSE:  Value: 105 mg/dl COMPARISON:  CT the chest, abdomen and pelvis 12/21/2005. FINDINGS: NECK Thickening of the lymphoid tissue in Waldeyer's ring, asymmetric (left greater than right), which demonstrates diffuse hypermetabolism (SUVmax = 9.2) Right-sided level IIa lymph  node measuring 8 mm in short axis (image 30 of series 4) is hypermetabolic (SUVmax = 6.0 on). Likewise, there is a 7 mm short axis left level IIa lymph node (image 20 of series 4), which is also mildly hypermetabolic (SUVmax = 4.3). The soft tissues lateral to the left side of the angle of the mandible there is a well-circumscribed 2.5 cm low-attenuation lesion which does not demonstrate internal hypermetabolism, which is presumably the cyst in the inferior aspect of the parotid gland. Posterior and cephalad to this in the superficial soft tissues of the parotid gland immediately inferior to the external auditory canal along the inferior aspect of the ear there is a focal 2.4 x 1.4 cm soft tissue attenuation lesion (image 17 of series 4) which demonstrates hypermetabolism (SUVmax = 8.9). CHEST No pathologically enlarged hypermetabolic mediastinal or hilar lymph nodes. No suspicious appearing hypermetabolic pulmonary nodules or masses. No acute consolidative airspace disease. No pleural effusions. Scattered lung cysts are incidentally noted. Heart size is normal. There is no significant pericardial fluid, thickening or pericardial calcification. There is aortic atherosclerosis, as well as atherosclerosis of the great  vessels of the mediastinum and the coronary arteries, including calcified atherosclerotic plaque in the left main and left anterior descending coronary arteries. ABDOMEN/PELVIS No abnormal hypermetabolic activity within the liver, pancreas, adrenal glands, or spleen. No hypermetabolic lymph nodes in the abdomen or pelvis. Status post cholecystectomy. Normal appendix. No pathologic dilatation of small bowel or colon. SKELETON There multiple focal areas of mild increased metabolic activity throughout the visualized axial and appendicular skeleton, however, none of these correspond to suspicious appearing lytic or blastic lesions on the CT portion of the examination, and this is presumably indicative of heterogeneous normal physiologic marrow activity. IMPRESSION: 1. Diffuse hypermetabolism throughout Waldeyer's ring which demonstrates diffuse soft tissue thickening (left greater than right). Small adjacent bilateral level IIa lymph nodes demonstrate hypermetabolism, as above. 2. In addition, there is a focal area of soft tissue prominence in the inferior aspect of the left parotid gland immediately inferior to the left external auditory canal extending to the skin surface in the inferior aspect of the left year which demonstrates hypermetabolism. This may represent an additional focus of disease. 3. Aortic atherosclerosis, in addition to left main and left anterior descending coronary artery disease. Please note that although the presence of coronary artery calcium documents the presence of coronary artery disease, the severity of this disease and any potential stenosis cannot be assessed on this non-gated CT examination. Assessment for potential risk factor modification, dietary therapy or pharmacologic therapy may be warranted, if clinically indicated. 4. Additional incidental findings, as above. Electronically Signed   By: Vinnie Langton M.D.   On: 02/24/2016 10:34    ASSESSMENT & PLAN:   56 year old  generally healthy Caucasian male with previous history of right parotid gland lesion resected in 2003 and monitored with scans by Dr. Eston Esters now presents with   1) Left Parotid gland slowly growing mass this was resected by Dr. Constance Holster and pathology is consistent with an extranodal marginal Zone lymphoma of the parotid MALT tissue. PET/CT suggest possible residual lesion in the parotid gland. There are a few small upper cervical LN's and uptake in the Waldeyer's ring - Uncertain if this represents active lymphoma at this time. Patient notes that he has not had a pre-operative CT of neck to determine if  these might be reactive after surgery. LDH WNL Hepatitis profile neg. Plan -I spent a significant period of time discussing  with the patient and his wife his pathology results, diagnosis, PET/CT results including reviewing images prognosis and treatment guidelines. -I'll have to touch base with Dr. Constance Holster to determine his steak on the residual FDG avid lesions to determine if the residual parotid lesion, cervical lymph nodes and Waldeyer's ring are reactive in his opinion or if tissue biopsy/diagnosis is possible since this might determine treatment approach. - if he had completely resected parotid only involvement with extranodal MALT lymphoma we would monitor him clinically q3 months and with imaging q45months -if limited non surgically resectable disease - would consider ISRT to try to achieve NED status since this might lead to non term non progression. -if waldeyer ring, cervical LN also involved - would need to consider observation vs treatment with Rituxan or Bendamustine/Rituxan. -Patient was also given the option of interval followup alone in 3 months with rpt imaging but if is favor of a more definitive approach to determine status of residual disease so we shall reach out to Dr Constance Holster for his opinion reading resectability/Biopsy of residual parotid lesion and clinical evaluation/Bx of other  FDG avid lesions in [facility] ring/upper cervical LN's. -If no additional Bx/resection possible will plan to f/u in 3 months with labs and rpt imaging.\ -continue f/u with PCP for other medical co-morbidities.  All of the patients questions were answered with apparent satisfaction. The patient knows to call the clinic with any problems, questions or concerns.  I spent 50 minutes counseling the patient face to face. The total time spent in the appointment was 60 minutes and more than 50% was on counseling and direct patient cares.    Sullivan Lone MD [place] AAHIVMS [facility] Hematology/Oncology Physician [facility]  (Office):       [phone] (Work cell):  [phone] (Fax):           [phone]  03/09/2016 12:21 PM

## 2016-03-10 LAB — HEPATITIS C ANTIBODY: Hep C Virus Ab: <0.1 s/co ratio (ref 0.0–0.9)

## 2016-03-10 LAB — HEPATITIS B SURFACE ANTIGEN: HBsAg Screen: NEGATIVE

## 2016-03-10 LAB — HEPATITIS B CORE ANTIBODY, TOTAL: HEP B C TOTAL AB: NEGATIVE

## 2016-03-26 ENCOUNTER — Ambulatory Visit: Admit: 2016-03-26 | Payer: BLUE CROSS/BLUE SHIELD | Admitting: Otolaryngology

## 2016-03-26 SURGERY — LYMPH NODE BIOPSY
Anesthesia: General | Laterality: Left

## 2016-04-06 ENCOUNTER — Other Ambulatory Visit: Payer: Self-pay | Admitting: Otolaryngology

## 2016-04-06 ENCOUNTER — Other Ambulatory Visit (HOSPITAL_COMMUNITY)
Admission: RE | Admit: 2016-04-06 | Discharge: 2016-04-06 | Disposition: A | Discharge status: Home / Self Care | Payer: BLUE CROSS/BLUE SHIELD | Source: Ambulatory Visit | Attending: Otolaryngology | Admitting: Otolaryngology

## 2016-04-06 DIAGNOSIS — C8511 Unspecified B-cell lymphoma, lymph nodes of head, face, and neck: Secondary | ICD-10-CM | POA: Diagnosis not present

## 2016-04-06 DIAGNOSIS — J351 Hypertrophy of tonsils: Secondary | ICD-10-CM | POA: Diagnosis not present

## 2016-04-06 DIAGNOSIS — C8591 Non-Hodgkin lymphoma, unspecified, lymph nodes of head, face, and neck: Secondary | ICD-10-CM | POA: Diagnosis not present

## 2016-04-18 ENCOUNTER — Telehealth: Payer: Self-pay | Admitting: *Deleted

## 2016-04-18 NOTE — Telephone Encounter (Signed)
Received vm call from pt stating that Dr Irene Limbo probably has seen his surgical report by now & he would like to get an appt earlier than end of May.  His call back # is [phone].  Message routed to Dr Launa Flight RN

## 2016-04-20 ENCOUNTER — Telehealth: Payer: Self-pay | Admitting: Hematology

## 2016-04-20 NOTE — Telephone Encounter (Signed)
Unable to reach patient. r/s appt per schedule message from [facility] . Mailed out appt letter to patient.

## 2016-05-02 ENCOUNTER — Encounter: Payer: Self-pay | Admitting: Hematology

## 2016-05-02 ENCOUNTER — Other Ambulatory Visit (HOSPITAL_BASED_OUTPATIENT_CLINIC_OR_DEPARTMENT_OTHER): Payer: BLUE CROSS/BLUE SHIELD

## 2016-05-02 ENCOUNTER — Telehealth: Payer: Self-pay | Admitting: Hematology

## 2016-05-02 ENCOUNTER — Ambulatory Visit (HOSPITAL_BASED_OUTPATIENT_CLINIC_OR_DEPARTMENT_OTHER): Payer: BLUE CROSS/BLUE SHIELD | Admitting: Hematology

## 2016-05-02 VITALS — BP 130/84 | HR 71 | Temp 98.4°F | Resp 18 | Ht 70.0 in | Wt 237.9 lb

## 2016-05-02 DIAGNOSIS — C884 Extranodal marginal zone B-cell lymphoma of mucosa-associated lymphoid tissue [MALT-lymphoma]: Secondary | ICD-10-CM | POA: Diagnosis not present

## 2016-05-02 LAB — CBC & DIFF AND RETIC
BASO%: 0.5 % (ref 0.0–2.0)
Basophils Absolute: 0 10*3/uL (ref 0.0–0.1)
EOS ABS: 0.1 10*3/uL (ref 0.0–0.5)
EOS%: 2.7 % (ref 0.0–7.0)
HEMATOCRIT: 45.3 % (ref 38.4–49.9)
HEMOGLOBIN: 15.4 g/dL (ref 13.0–17.1)
IMMATURE RETIC FRACT: 7.1 % (ref 3.00–10.60)
LYMPH#: 1.8 10*3/uL (ref 0.9–3.3)
LYMPH%: 39.2 % (ref 14.0–49.0)
MCH: 29 pg (ref 27.2–33.4)
MCHC: 33.9 g/dL (ref 32.0–36.0)
MCV: 85.5 fL (ref 79.3–98.0)
MONO#: 0.4 10*3/uL (ref 0.1–0.9)
MONO%: 9.3 % (ref 0.0–14.0)
NEUT#: 2.2 10*3/uL (ref 1.5–6.5)
NEUT%: 48.3 % (ref 39.0–75.0)
PLATELETS: 147 10*3/uL (ref 140–400)
RBC: 5.3 10*6/uL (ref 4.20–5.82)
RDW: 13.5 % (ref 11.0–14.6)
RETIC CT ABS: 63.07 10*3/uL (ref 34.80–93.90)
Retic %: 1.19 % (ref 0.80–1.80)
WBC: 4.6 10*3/uL (ref 4.0–10.3)

## 2016-05-02 LAB — COMPREHENSIVE METABOLIC PANEL
ALBUMIN: 4 g/dL (ref 3.5–5.0)
ALK PHOS: 59 U/L (ref 40–150)
ALT: 43 U/L (ref 0–55)
ANION GAP: 10 meq/L (ref 3–11)
AST: 26 U/L (ref 5–34)
BILIRUBIN TOTAL: 0.94 mg/dL (ref 0.20–1.20)
BUN: 19.2 mg/dL (ref 7.0–26.0)
CALCIUM: 10.1 mg/dL (ref 8.4–10.4)
CHLORIDE: 106 meq/L (ref 98–109)
CO2: 25 mEq/L (ref 22–29)
CREATININE: 0.9 mg/dL (ref 0.7–1.3)
EGFR: >90 mL/min/{1.73_m2} (ref >90)
Glucose: 113 mg/dl (ref 70–140)
Potassium: 3.7 mEq/L (ref 3.5–5.1)
Sodium: 142 mEq/L (ref 136–145)
TOTAL PROTEIN: 8.1 g/dL (ref 6.4–8.3)

## 2016-05-02 LAB — LACTATE DEHYDROGENASE: LDH: 166 U/L (ref 125–245)

## 2016-05-02 NOTE — Telephone Encounter (Signed)
Per 4/11 los - Gave patient calender and AVS

## 2016-05-26 NOTE — Progress Notes (Signed)
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  HISTORY OF PRESENTING ILLNESS:  Garrett Keller is a wonderful 56 y.o. 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  Patient reports that he had a right parotid mass excised in 2003 but that there was no definitive pathologic diagnosis. He was followed by Dr. Eston Esters from oncology with interval scans but had no new concerns until 2007.  Patient recently presented with a fullness in the left side of the face with a small palpable mass possibly present for several months but was noticed about 2 months ago. He was evaluated by Dr. Constance Holster from ENT on 12/23/2015    He subsequently had a resection of his left parotid mass on 02/01/2016 which showed a MALT lymphoma of the left parotid gland. Patient subsequently had a PET/CT scan on 02/24/2016 which showed Diffuse hypermetabolism throughout [facility] ring which demonstrates diffuse soft tissue thickening (left greater than right). Small adjacent bilateral level IIa lymph nodes demonstrate hypermetabolism, as above. In addition, there is a focal area of soft tissue prominence in the inferior aspect of the left parotid gland immediately inferior to the left external auditory canal extending to the skin surface in the inferior aspect of the left year which demonstrates hypermetabolism. This may represent an additional focus of disease. No evidence of FDG avid lymphadenopathy in the chest or abdomen or focal skeletal lesions .  Patient notes that he has healed well from surgery . He has no specific focal symptoms . No fevers no chills no night sweats no unexpected weight loss . He  overall feels quite well .  INTERVAL HISTORY  [name] is here for /u for his MALT lmphoma. Since his initial assessment she followed up with Dr. Constance Holster for an additional lymph node biopsy which also is noted to show MALT lymphoma. We discussed the results of his new lymph node biopsy in details. We also discussed all the different treatment options including observation versus Rituxan monotherapy vs ISRT vs chemo-immunotherapy and the rationale for different treatments. Patient chooses to proceed with active surveillance and observation is what we would recommend. No fevers no chills no night sweats. He feels well and is eating well.  MEDICAL HISTORY:  Past Medical History:  Diagnosis Date  . Arthritis   Rt parotid mass excised in 2003 - patient notes there was no definitive diagnosis. Patient was followed by Dr. Truddie Coco until 2007. Obesity GERD Depression with PTSD from age 67 years on Lexapro and when necessary Ativan BPH with elevated prostate specific antigen History of right axillary MRSA infection with abscess in the past.  SURGICAL HISTORY: Past Surgical History:  Procedure Laterality Date  . CHOLECYSTECTOMY  2008  . salvary gland  2003   large gland inside of cheek    SOCIAL HISTORY: Social History   Social History  . Marital status: Married    Spouse name: N/A  . Number of children: N/A  . Years of education: N/A   Occupational History  . Not on file.   Social History Main Topics  . Smoking status: Never Smoker  . Smokeless tobacco: Never Used  . Alcohol use Yes     Comment:  weekeneds  . Drug use: No  . Sexual activity: Not on file   Other Topics Concern  . Not on file   Social History Narrative  . No narrative on file  Has been a smoker but has had a history of exposure to secondhand smoke . 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 ALLERGIES:  is allergic to adhesive [tape].  MEDICATIONS:  Current Outpatient Prescriptions  Medication Sig Dispense Refill  . LORazepam (ATIVAN) 0.5 MG tablet     . omeprazole (PRILOSEC) 20 MG capsule      No current facility-administered medications for this visit.     REVIEW OF SYSTEMS:    10 Point review of Systems was done is negative except as noted above.  PHYSICAL EXAMINATION: ECOG PERFORMANCE STATUS: 0 - Asymptomatic  . Vitals:   05/02/16 1459  BP: 130/84  Pulse: 71  Resp: 18  Temp: 98.4 F (36.9 C)   Filed Weights   05/02/16 1459  Weight: 237 lb 14.4 oz (107.9 kg)   .Body mass index is 34.14 kg/m.  GENERAL:alert, in no acute distress and comfortable SKIN: skin color, texture, turgor are normal, no rashes or significant lesions EYES: normal, conjunctiva are pink and non-injected, sclera clear OROPHARYNX:no exudate, no erythema and lips, buccal mucosa, and tongue normal  NECK: supple, no JVD,  Healed surgical area over left parotid            no palpable lymphadenopathy in the cervical, axillary or inguinal LUNGS: clear to auscultation with normal respiratory effort HEART: regular rate & rhythm,  no murmurs and no lower extremity edema ABDOMEN: abdomen soft, non-tender, normoactive bowel sounds , no palpable hepato-splenomegaly Musculoskeletal: no cyanosis of digits and no clubbing  PSYCH: alert & oriented x 3 with fluent speech NEURO: no focal motor/sensory deficits  LABORATORY DATA:  I have reviewed the data as listed  . CBC Latest Ref Rng & Units 05/02/2016 03/09/2016 12/20/2005  WBC 4.0 - 10.3 10e3/uL 4.6 4.2 4.7  Hemoglobin 13.0 - 17.1 g/dL 15.4 14.8 14.8  Hematocrit 38.4 - 49.9 % 45.3 42.3 42.7  Platelets 140 - 400 10e3/uL 147 150 169    . CMP Latest Ref Rng & Units 05/02/2016 03/09/2016 12/20/2005  Glucose 70 - 140 mg/dl 113 99 92  BUN 7.0 - 26.0 mg/dL 19.2 17.2 19  Creatinine 0.7 - 1.3 mg/dL 0.9 0.9 1.05  Sodium 136  - 145 mEq/L 142 141 139  Potassium 3.5 - 5.1 mEq/L 3.7 3.6 3.6  Chloride 96 - 112 mEq/L - - 106  CO2 22 - 29 mEq/L 25 22 25   Calcium 8.4 - 10.4 mg/dL 10.1 9.5 9.0  Total Protein 6.4 - 8.3 g/dL 8.1 8.1 7.8  Total Bilirubin 0.20 - 1.20 mg/dL 0.94 0.66 0.8  Alkaline Phos 40 - 150 U/L 59 56 45  AST 5 - 34 U/L 26 21 20   ALT 0 - 55 U/L 43 35 33   Component     Latest Ref Rng & Units 03/09/2016  LDH     125 - 245 U/L 158  Hep C Virus Ab     0.0 - 0.9 s/co ratio <0.1  Hepatitis B Surface Ag     Negative Negative  Hep B Core Ab, Tot     Negative Negative        RADIOGRAPHIC STUDIES: I have  personally reviewed the radiological images as listed and agreed with the findings in the report. No results found.  ASSESSMENT & PLAN:   56 year old generally healthy Caucasian male with previous history of right parotid gland lesion resected in 2003 and monitored with scans by Dr. Eston Esters now presents with   1) Left Parotid gland slowly growing mass this was resected by Dr. Constance Holster and pathology is consistent with an extranodal marginal Zone lymphoma of the parotid MALT tissue. PET/CT suggest possible residual lesion in the parotid gland. There are a few small upper cervical LN's and uptake in the Waldeyer's ring -   Cervical lymph node biopsy is consistent with MALT lymphoma involvement.  Patient notes that he has not had a pre-operative CT of neck to determine if  these might be reactive after surgery. LDH WNL Hepatitis profile neg. Plan -I spent a significant period of time discussing with the patient and his wife his new pathology results -After discussing the various treatment options  including observation versus Rituxan monotherapy vs ISRT vs chemo-immunotherapy and the rationale for different treatments.  -Patient chooses to proceed with active surveillance and observation is what we would recommend since he does not have much residual MALT lymphoma at this time and has no symptoms  attributable to this. -continue f/u with PCP for other medical co-morbidities.  RTC with Dr Irene Limbo in 3 months with labs Will rpt CT neck/chest/abd in 6 months  All of the patients questions were answered with apparent satisfaction. The patient knows to call the clinic with any problems, questions or concerns.  I spent 20 minutes counseling the patient face to face. The total time spent in the appointment was 30 minutes and more than 50% was on counseling and direct patient cares.    Sullivan Lone MD [place] AAHIVMS [facility] Hematology/Oncology Physician [facility]  (Office):       [phone] (Work cell):  [phone] (Fax):           [phone]  05/26/2016 3:36 PM

## 2016-05-28 DIAGNOSIS — F419 Anxiety disorder, unspecified: Secondary | ICD-10-CM | POA: Diagnosis not present

## 2016-05-28 DIAGNOSIS — K219 Gastro-esophageal reflux disease without esophagitis: Secondary | ICD-10-CM | POA: Diagnosis not present

## 2016-05-28 DIAGNOSIS — E785 Hyperlipidemia, unspecified: Secondary | ICD-10-CM | POA: Diagnosis not present

## 2016-05-28 DIAGNOSIS — J309 Allergic rhinitis, unspecified: Secondary | ICD-10-CM | POA: Diagnosis not present

## 2016-06-07 ENCOUNTER — Other Ambulatory Visit: Payer: BLUE CROSS/BLUE SHIELD

## 2016-06-07 ENCOUNTER — Ambulatory Visit: Payer: BLUE CROSS/BLUE SHIELD | Admitting: Hematology

## 2016-07-27 ENCOUNTER — Telehealth: Payer: Self-pay | Admitting: Hematology

## 2016-07-27 ENCOUNTER — Other Ambulatory Visit: Payer: Self-pay | Admitting: *Deleted

## 2016-07-27 NOTE — Telephone Encounter (Signed)
lvm about appointment change on with

## 2016-08-01 ENCOUNTER — Other Ambulatory Visit: Payer: BLUE CROSS/BLUE SHIELD

## 2016-08-01 ENCOUNTER — Ambulatory Visit: Payer: BLUE CROSS/BLUE SHIELD | Admitting: Hematology

## 2016-08-17 ENCOUNTER — Encounter: Payer: Self-pay | Admitting: Hematology

## 2016-08-17 ENCOUNTER — Other Ambulatory Visit (HOSPITAL_BASED_OUTPATIENT_CLINIC_OR_DEPARTMENT_OTHER): Payer: BLUE CROSS/BLUE SHIELD

## 2016-08-17 ENCOUNTER — Ambulatory Visit (HOSPITAL_BASED_OUTPATIENT_CLINIC_OR_DEPARTMENT_OTHER): Payer: BLUE CROSS/BLUE SHIELD | Admitting: Hematology

## 2016-08-17 VITALS — BP 134/97 | HR 58 | Temp 98.3°F | Resp 18 | Ht 70.0 in | Wt 234.9 lb

## 2016-08-17 DIAGNOSIS — C884 Extranodal marginal zone B-cell lymphoma of mucosa-associated lymphoid tissue [MALT-lymphoma]: Secondary | ICD-10-CM | POA: Diagnosis not present

## 2016-08-17 LAB — CBC & DIFF AND RETIC
BASO%: 0.2 % (ref 0.0–2.0)
BASOS ABS: 0 10*3/uL (ref 0.0–0.1)
EOS%: 2.5 % (ref 0.0–7.0)
Eosinophils Absolute: 0.1 10*3/uL (ref 0.0–0.5)
HEMATOCRIT: 46.1 % (ref 38.4–49.9)
HGB: 15.9 g/dL (ref 13.0–17.1)
Immature Retic Fract: 1.2 % — ABNORMAL LOW (ref 3.00–10.60)
LYMPH#: 1.6 10*3/uL (ref 0.9–3.3)
LYMPH%: 38.7 % (ref 14.0–49.0)
MCH: 30.1 pg (ref 27.2–33.4)
MCHC: 34.5 g/dL (ref 32.0–36.0)
MCV: 87.3 fL (ref 79.3–98.0)
MONO#: 0.5 10*3/uL (ref 0.1–0.9)
MONO%: 11.1 % (ref 0.0–14.0)
NEUT#: 1.9 10*3/uL (ref 1.5–6.5)
NEUT%: 47.5 % (ref 39.0–75.0)
Platelets: 139 10*3/uL — ABNORMAL LOW (ref 140–400)
RBC: 5.28 10*6/uL (ref 4.20–5.82)
RDW: 12.9 % (ref 11.0–14.6)
RETIC %: 0.9 % (ref 0.80–1.80)
RETIC CT ABS: 47.52 10*3/uL (ref 34.80–93.90)
WBC: 4.1 10*3/uL (ref 4.0–10.3)

## 2016-08-17 LAB — COMPREHENSIVE METABOLIC PANEL
ALT: 28 U/L (ref 0–55)
ANION GAP: 9 meq/L (ref 3–11)
AST: 22 U/L (ref 5–34)
Albumin: 4 g/dL (ref 3.5–5.0)
Alkaline Phosphatase: 58 U/L (ref 40–150)
BUN: 13.5 mg/dL (ref 7.0–26.0)
CHLORIDE: 106 meq/L (ref 98–109)
CO2: 22 meq/L (ref 22–29)
Calcium: 9.8 mg/dL (ref 8.4–10.4)
Creatinine: 0.9 mg/dL (ref 0.7–1.3)
EGFR: >90 mL/min/{1.73_m2} (ref >90)
Glucose: 92 mg/dl (ref 70–140)
POTASSIUM: 4.1 meq/L (ref 3.5–5.1)
Sodium: 137 mEq/L (ref 136–145)
Total Bilirubin: 0.95 mg/dL (ref 0.20–1.20)
Total Protein: 8.4 g/dL — ABNORMAL HIGH (ref 6.4–8.3)

## 2016-08-17 LAB — LACTATE DEHYDROGENASE: LDH: 142 U/L (ref 125–245)

## 2016-08-17 NOTE — Patient Instructions (Signed)
Thank you for choosing [facility] to provide your oncology and hematology care.  To afford each patient quality time with our providers, please arrive 30 minutes before your scheduled appointment time.  If you arrive late for your appointment, you may be asked to reschedule.  We strive to give you quality time with our providers, and arriving late affects you and other patients whose appointments are after yours.  If you are a no show for multiple scheduled visits, you may be dismissed from the clinic at the providers discretion.   Again, thank you for choosing [facility], our hope is that these requests will decrease the amount of time that you wait before being seen by our physicians.  ______________________________________________________________________ Should you have questions after your visit to the [facility], please contact our office at [phone] between the hours of 8:30 and 4:30 p.m.    Voicemails left after 4:30p.m will not be returned until the following business day.   For prescription refill requests, please have your pharmacy contact us directly.  Please also try to allow 48 hours for prescription requests.   Please contact the scheduling department for questions regarding scheduling.  For scheduling of procedures such as PET scans, CT scans, MRI, Ultrasound, etc please contact central scheduling at [phone].   Resources For Cancer Patients and Caregivers:  American Cancer Society:  800-227-2345  Can help patients locate various types of support and financial assistance Cancer Care: 1-800-813-HOPE (4673) Provides financial assistance, online support groups, medication/co-pay assistance.   [place] DSS:  336-641-3447 Where to apply for food stamps, Medicaid, and utility assistance Medicare Rights Center: 800-333-4114 Helps people with Medicare understand their rights and benefits, navigate the Medicare system, and secure the  quality healthcare they deserve SCAT: 336-333-6589 [place] [employer]'s shared-ride transportation service for eligible riders who have a disability that prevents them from riding the fixed route bus.   For additional information on assistance programs please contact our social worker:   Grier Hock/Abigail Elmore:  [phone] 

## 2016-08-19 NOTE — Progress Notes (Signed)
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  HISTORY OF PRESENTING ILLNESS:  Garrett Keller is a wonderful 56 y.o. 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  Patient reports that he had a right parotid mass excised in 2003 but that there was no definitive pathologic diagnosis. He was followed by Dr. Eston Esters from oncology with interval scans but had no new concerns until 2007.  Patient recently presented with a fullness in the left side of the face with a small palpable mass possibly present for several months but was noticed about 2 months ago. He was evaluated by Dr. Constance Holster from ENT on 12/23/2015    He subsequently had a resection of his left parotid mass on 02/01/2016 which showed a MALT lymphoma of the left parotid gland. Patient subsequently had a PET/CT scan on 02/24/2016 which showed Diffuse hypermetabolism throughout [facility] ring which demonstrates diffuse soft tissue thickening (left greater than right). Small adjacent bilateral level IIa lymph nodes demonstrate hypermetabolism, as above. In addition, there is a focal area of soft tissue prominence in the inferior aspect of the left parotid gland immediately inferior to the left external auditory canal extending to the skin surface in the inferior aspect of the left year which demonstrates hypermetabolism. This may represent an additional focus of disease. No evidence of FDG avid lymphadenopathy in the chest or abdomen or focal skeletal lesions .  Patient notes that he has healed well from surgery . He has no specific focal symptoms . No fevers no chills no night sweats no unexpected weight loss . He overall  feels quite well .  INTERVAL HISTORY  Garrett Keller is here for /u for his MALT lmphoma. He notes no new enlarged lymph nodes or masses in his neck. No new enlarged lymph nodes elsewhere. Overall fields very well and has no acute new symptoms. No fevers no chills no night sweats.   MEDICAL HISTORY:  Past Medical History:  Diagnosis Date  . Arthritis   Rt parotid mass excised in 2003 - patient notes there was no definitive diagnosis. Patient was followed by Dr. Truddie Coco until 2007. 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  SURGICAL HISTORY: Past Surgical History:  Procedure Laterality Date  . CHOLECYSTECTOMY  2008  . salvary gland  2003   large gland inside of cheek    SOCIAL HISTORY: Social History   Social History  . Marital status: Married    Spouse name: N/A  . Number of children: N/A  . Years of education: N/A   Occupational History  . Not on file.   Social History Main Topics  . Smoking status: Never Smoker  . Smokeless tobacco: Never Used  . Alcohol use Yes     Comment: weekeneds  . Drug use: No  . Sexual activity: Not on file   Other Topics Concern  . Not on file   Social History Narrative  . No narrative on file  Has been a smoker but has had a history of exposure to secondhand smoke . Works  as a Librarian, academic in [employer] and reports exposure to dusts   FAMILY HISTORY: Patient notes that he was adopted and doesn't have much information about his biological parents . ALLERGIES:  is allergic to adhesive [tape].  MEDICATIONS:  Current Outpatient Prescriptions  Medication Sig Dispense Refill  . LORazepam (ATIVAN) 0.5 MG tablet     . omeprazole (PRILOSEC) 20 MG capsule      No current facility-administered medications for this visit.     REVIEW OF SYSTEMS:    10 Point review of Systems was  done is negative except as noted above.  PHYSICAL EXAMINATION: ECOG PERFORMANCE STATUS: 0 - Asymptomatic  . Vitals:   08/17/16 1001  BP: (!) 134/97  Pulse: (!) 58  Resp: 18  Temp: 98.3 F (36.8 C)   Filed Weights   08/17/16 1001  Weight: 234 lb 14.4 oz (106.5 kg)   .Body mass index is 33.7 kg/m.  GENERAL:alert, in no acute distress and comfortable SKIN: skin color, texture, turgor are normal, no rashes or significant lesions EYES: normal, conjunctiva are pink and non-injected, sclera clear OROPHARYNX:no exudate, no erythema and lips, buccal mucosa, and tongue normal  NECK: supple, no JVD,  Healed surgical area over left parotid            no palpable lymphadenopathy in the cervical, axillary or inguinal LUNGS: clear to auscultation with normal respiratory effort HEART: regular rate & rhythm,  no murmurs and no lower extremity edema ABDOMEN: abdomen soft, non-tender, normoactive bowel sounds , no palpable hepato-splenomegaly Musculoskeletal: no cyanosis of digits and no clubbing  PSYCH: alert & oriented x 3 with fluent speech NEURO: no focal motor/sensory deficits  LABORATORY DATA:  I have reviewed the data as listed  . CBC Latest Ref Rng & Units 08/17/2016 05/02/2016 03/09/2016  WBC 4.0 - 10.3 10e3/uL 4.1 4.6 4.2  Hemoglobin 13.0 - 17.1 g/dL 15.9 15.4 14.8  Hematocrit 38.4 - 49.9 % 46.1 45.3 42.3  Platelets 140 - 400 10e3/uL 139(L) 147 150    . CMP Latest Ref Rng & Units 08/17/2016 05/02/2016 03/09/2016  Glucose 70 - 140 mg/dl 92 113 99  BUN 7.0 - 26.0 mg/dL 13.5 19.2 17.2  Creatinine 0.7 - 1.3 mg/dL 0.9 0.9 0.9  Sodium 136 - 145 mEq/L 137 142 141  Potassium 3.5 - 5.1 mEq/L 4.1 3.7 3.6  Chloride 96 - 112 mEq/L - - -  CO2 22 - 29 mEq/L 22 25 22   Calcium 8.4 - 10.4 mg/dL 9.8 10.1 9.5  Total Protein 6.4 - 8.3 g/dL 8.4(H) 8.1 8.1  Total Bilirubin 0.20 - 1.20 mg/dL 0.95 0.94 0.66  Alkaline Phos 40 - 150 U/L 58 59 56  AST 5 - 34 U/L 22 26 21   ALT 0 - 55 U/L 28 43 35    . Lab Results  Component Value Date   LDH 142 08/17/2016    Component     Latest Ref Rng & Units 03/09/2016  LDH     125 - 245 U/L 158  Hep C Virus Ab     0.0 - 0.9 s/co ratio <0.1  Hepatitis B Surface Ag     Negative Negative  Hep B Core Ab, Tot     Negative Negative        RADIOGRAPHIC STUDIES: I have personally reviewed the radiological images as listed and agreed with the findings in the report. No results found.  ASSESSMENT & PLAN:   56 year old generally healthy Caucasian male with previous history of right  parotid gland lesion resected in 2003 and monitored with scans by Dr. Eston Esters now presents with   1) Left Parotid gland slowly growing mass this was resected by Dr. Constance Holster and pathology is consistent with an extranodal marginal Zone lymphoma of the parotid MALT tissue. PET/CT suggest possible residual lesion in the parotid gland. There are a few small upper cervical LN's and uptake in the Waldeyer's ring -  Cervical lymph node biopsy is consistent with MALT lymphoma involvement. After discussing the various treatment options  including observation versus Rituxan monotherapy vs ISRT vs chemo-immunotherapy and the rationale for different treatments. Patient choose to proceed with active surveillance and observation, which is what we would recommend since he does not have much residual MALT lymphoma and had no symptoms attributable to this.  Hepatitis profile neg. Plan -Patient has no clinical or lab evidence of significant lymphoma progression at this time. LDH is within normal limits. -He has mild thrombocytopenia which will be monitored -No new lymphadenopathy or hepatosplenomegaly. Rest of his labs are stable. -No new indication for treatment of his indolent lymphoma at this time.  RTC in 4 months with labs and CT neck/chest/abd/pelvis with Dr Irene Limbo   All of the patients questions were answered with apparent satisfaction. The patient knows to call the clinic  with any problems, questions or concerns.  I spent 20 minutes counseling the patient face to face. The total time spent in the appointment was 25 minutes and more than 50% was on counseling and direct patient cares.    Sullivan Lone MD [place] AAHIVMS [facility] Hematology/Oncology Physician [facility]  (Office):       [phone] (Work cell):  [phone] (Fax):           [phone]

## 2016-12-04 DIAGNOSIS — F419 Anxiety disorder, unspecified: Secondary | ICD-10-CM | POA: Diagnosis not present

## 2016-12-04 DIAGNOSIS — E785 Hyperlipidemia, unspecified: Secondary | ICD-10-CM | POA: Diagnosis not present

## 2016-12-04 DIAGNOSIS — G4700 Insomnia, unspecified: Secondary | ICD-10-CM | POA: Diagnosis not present

## 2016-12-04 DIAGNOSIS — R0683 Snoring: Secondary | ICD-10-CM | POA: Diagnosis not present

## 2016-12-21 ENCOUNTER — Other Ambulatory Visit (HOSPITAL_BASED_OUTPATIENT_CLINIC_OR_DEPARTMENT_OTHER): Payer: Self-pay

## 2016-12-21 ENCOUNTER — Ambulatory Visit (HOSPITAL_COMMUNITY)
Admission: RE | Admit: 2016-12-21 | Discharge: 2016-12-21 | Disposition: A | Discharge status: Home / Self Care | Payer: BLUE CROSS/BLUE SHIELD | Source: Ambulatory Visit | Attending: Hematology | Admitting: Hematology

## 2016-12-21 ENCOUNTER — Other Ambulatory Visit: Payer: BLUE CROSS/BLUE SHIELD

## 2016-12-21 DIAGNOSIS — R918 Other nonspecific abnormal finding of lung field: Secondary | ICD-10-CM | POA: Diagnosis not present

## 2016-12-21 DIAGNOSIS — C884 Extranodal marginal zone B-cell lymphoma of mucosa-associated lymphoid tissue [MALT-lymphoma]: Secondary | ICD-10-CM | POA: Insufficient documentation

## 2016-12-21 DIAGNOSIS — I2510 Atherosclerotic heart disease of native coronary artery without angina pectoris: Secondary | ICD-10-CM | POA: Diagnosis not present

## 2016-12-21 DIAGNOSIS — N400 Enlarged prostate without lower urinary tract symptoms: Secondary | ICD-10-CM | POA: Insufficient documentation

## 2016-12-21 DIAGNOSIS — J351 Hypertrophy of tonsils: Secondary | ICD-10-CM | POA: Insufficient documentation

## 2016-12-21 DIAGNOSIS — R162 Hepatomegaly with splenomegaly, not elsewhere classified: Secondary | ICD-10-CM | POA: Diagnosis not present

## 2016-12-21 LAB — CBC & DIFF AND RETIC
BASO%: 0.2 % (ref 0.0–2.0)
Basophils Absolute: 0 10*3/uL (ref 0.0–0.1)
EOS%: 2.7 % (ref 0.0–7.0)
Eosinophils Absolute: 0.1 10*3/uL (ref 0.0–0.5)
HCT: 46.9 % (ref 38.4–49.9)
HGB: 15.9 g/dL (ref 13.0–17.1)
Immature Retic Fract: 3.8 % (ref 3.00–10.60)
LYMPH%: 40.2 % (ref 14.0–49.0)
MCH: 30.3 pg (ref 27.2–33.4)
MCHC: 33.9 g/dL (ref 32.0–36.0)
MCV: 89.3 fL (ref 79.3–98.0)
MONO#: 0.5 10*3/uL (ref 0.1–0.9)
MONO%: 13.4 % (ref 0.0–14.0)
NEUT%: 43.5 % (ref 39.0–75.0)
NEUTROS ABS: 1.8 10*3/uL (ref 1.5–6.5)
Platelets: 129 10*3/uL — ABNORMAL LOW (ref 140–400)
RBC: 5.25 10*6/uL (ref 4.20–5.82)
RDW: 13.1 % (ref 11.0–14.6)
Retic %: 1.04 % (ref 0.80–1.80)
Retic Ct Abs: 54.6 10*3/uL (ref 34.80–93.90)
WBC: 4 10*3/uL (ref 4.0–10.3)
lymph#: 1.6 10*3/uL (ref 0.9–3.3)

## 2016-12-21 LAB — COMPREHENSIVE METABOLIC PANEL
ALT: 50 U/L (ref 0–55)
ANION GAP: 8 meq/L (ref 3–11)
AST: 31 U/L (ref 5–34)
Albumin: 4.1 g/dL (ref 3.5–5.0)
Alkaline Phosphatase: 52 U/L (ref 40–150)
BUN: 15.2 mg/dL (ref 7.0–26.0)
CO2: 25 meq/L (ref 22–29)
Calcium: 9.5 mg/dL (ref 8.4–10.4)
Chloride: 107 mEq/L (ref 98–109)
Creatinine: 1 mg/dL (ref 0.7–1.3)
GLUCOSE: 95 mg/dL (ref 70–140)
POTASSIUM: 4.4 meq/L (ref 3.5–5.1)
SODIUM: 140 meq/L (ref 136–145)
Total Bilirubin: 0.98 mg/dL (ref 0.20–1.20)
Total Protein: 8.5 g/dL — ABNORMAL HIGH (ref 6.4–8.3)

## 2016-12-21 LAB — LACTATE DEHYDROGENASE: LDH: 149 U/L (ref 125–245)

## 2016-12-21 MED ORDER — IOPAMIDOL (ISOVUE-300) INJECTION 61%
100.0000 mL | Freq: Once | INTRAVENOUS | Status: AC | PRN
  Administered 2016-12-21: 100 mL via INTRAVENOUS

## 2016-12-21 MED ORDER — IOPAMIDOL (ISOVUE-300) INJECTION 61%
INTRAVENOUS | Status: AC
  Filled 2016-12-21: qty 100

## 2016-12-28 ENCOUNTER — Telehealth: Payer: Self-pay | Admitting: Hematology

## 2016-12-28 ENCOUNTER — Ambulatory Visit (HOSPITAL_BASED_OUTPATIENT_CLINIC_OR_DEPARTMENT_OTHER): Payer: BLUE CROSS/BLUE SHIELD | Admitting: Hematology

## 2016-12-28 ENCOUNTER — Encounter: Payer: Self-pay | Admitting: Hematology

## 2016-12-28 VITALS — BP 140/93 | HR 72 | Temp 98.3°F | Resp 20 | Ht 70.0 in | Wt 235.8 lb

## 2016-12-28 DIAGNOSIS — C884 Extranodal marginal zone B-cell lymphoma of mucosa-associated lymphoid tissue [MALT-lymphoma]: Secondary | ICD-10-CM

## 2016-12-28 DIAGNOSIS — C8580 Other specified types of non-Hodgkin lymphoma, unspecified site: Secondary | ICD-10-CM

## 2016-12-28 NOTE — Patient Instructions (Signed)
Thank you for choosing [facility] to provide your oncology and hematology care.  To afford each patient quality time with our providers, please arrive 30 minutes before your scheduled appointment time.  If you arrive late for your appointment, you may be asked to reschedule.  We strive to give you quality time with our providers, and arriving late affects you and other patients whose appointments are after yours.  If you are a no show for multiple scheduled visits, you may be dismissed from the clinic at the providers discretion.   Again, thank you for choosing [facility], our hope is that these requests will decrease the amount of time that you wait before being seen by our physicians.  ______________________________________________________________________ Should you have questions after your visit to the [facility], please contact our office at [phone] between the hours of 8:30 and 4:30 p.m.    Voicemails left after 4:30p.m will not be returned until the following business day.   For prescription refill requests, please have your pharmacy contact us directly.  Please also try to allow 48 hours for prescription requests.   Please contact the scheduling department for questions regarding scheduling.  For scheduling of procedures such as PET scans, CT scans, MRI, Ultrasound, etc please contact central scheduling at [phone].   Resources For Cancer Patients and Caregivers:  American Cancer Society:  800-227-2345  Can help patients locate various types of support and financial assistance Cancer Care: 1-800-813-HOPE (4673) Provides financial assistance, online support groups, medication/co-pay assistance.   [place] DSS:  336-641-3447 Where to apply for food stamps, Medicaid, and utility assistance Medicare Rights Center: 800-333-4114 Helps people with Medicare understand their rights and benefits, navigate the Medicare system, and secure the  quality healthcare they deserve SCAT: 336-333-6589 [employer] [place] [employer]'s shared-ride transportation service for eligible riders who have a disability that prevents them from riding the fixed route bus.   For additional information on assistance programs please contact our social worker:   Grier Hock/Abigail Elmore:  [phone] 

## 2016-12-28 NOTE — Telephone Encounter (Signed)
Scheduled appt per 12/7 los - Gave patient AVS and calender per los. Lab and f/u in 6 months.

## 2017-01-01 NOTE — Progress Notes (Signed)
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  HISTORY OF PRESENTING ILLNESS:  Garrett Keller is a wonderful 56 y.o. 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  Patient reports that he had a right parotid mass excised in 2003 but that there was no definitive pathologic diagnosis. He was followed by Dr. Eston Esters from oncology with interval scans but had no new concerns until 2007.  Patient recently presented with a fullness in the left side of the face with a small palpable mass possibly present for several months but was noticed about 2 months ago. He was evaluated by Dr. Constance Holster from ENT on 12/23/2015    He subsequently had a resection of his left parotid mass on 02/01/2016 which showed a MALT lymphoma of the left parotid gland. Patient subsequently had a PET/CT scan on 02/24/2016 which showed Diffuse hypermetabolism throughout [facility] ring which demonstrates diffuse soft tissue thickening (left greater than right). Small adjacent bilateral level IIa lymph nodes demonstrate hypermetabolism, as above. In addition, there is a focal area of soft tissue prominence in the inferior aspect of the left parotid gland immediately inferior to the left external auditory canal extending to the skin surface in the inferior aspect of the left year which demonstrates hypermetabolism. This may represent an additional focus of disease. No evidence of FDG avid lymphadenopathy in the chest or abdomen or focal skeletal lesions .  Patient notes that he has healed well from surgery . He has no specific focal symptoms . No fevers no chills no night sweats no unexpected weight loss . He overall  feels quite well .  INTERVAL HISTORY  Garrett Keller is here for f/u for his MALT lmphoma and f/u of his  CT chest/abd/pelvis. He notes no new enlarged lymph nodes or masses in his neck. No new enlarged lymph nodes elsewhere. Overall feels very well and has no acute new symptoms. No fevers no chills no night sweats.  CT showed no evidence of significant new LNadenopathy.  MEDICAL HISTORY:  Past Medical History:  Diagnosis Date  . Arthritis   Rt parotid mass excised in 2003 - patient notes there was no definitive diagnosis. Patient was followed by Dr. Truddie Coco until 2007. 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  SURGICAL HISTORY: Past Surgical History:  Procedure Laterality Date  . CHOLECYSTECTOMY  2008  . salvary gland  2003   large gland inside of cheek    SOCIAL HISTORY: Social History   Socioeconomic History  . Marital status: Married    Spouse name: Not on file  . Number of children: Not on file  . Years of education: Not on file  . Highest education level: Not on file  Social Needs  . Financial resource strain: Not on file  . Food insecurity - worry: Not on file  . Food insecurity - inability: Not on file  . Transportation needs - medical: Not on file  . Transportation needs - non-medical: Not on file  Occupational History  . Not on file  Tobacco Use  .  Smoking status: Never Smoker  . Smokeless tobacco: Never Used  Substance and Sexual Activity  . Alcohol use: Yes    Comment: weekeneds  . Drug use: No  . Sexual activity: Not on file  Other Topics Concern  . Not on file  Social History Narrative  . Not on file  Has been a smoker but has had a history of exposure to secondhand smoke . Works as a Librarian, academic in [employer] and reports exposure to dusts   FAMILY HISTORY: Patient notes that he was  adopted and doesn't have much information about his biological parents . ALLERGIES:  is allergic to adhesive [tape].  MEDICATIONS:  Current Outpatient Medications  Medication Sig Dispense Refill  . LORazepam (ATIVAN) 0.5 MG tablet      No current facility-administered medications for this visit.     REVIEW OF SYSTEMS:    10 Point review of Systems was done is negative except as noted above.  PHYSICAL EXAMINATION: ECOG PERFORMANCE STATUS: 0 - Asymptomatic  . Vitals:   12/28/16 0859  BP: (!) 140/93  Pulse: 72  Resp: 20  Temp: 98.3 F (36.8 C)  SpO2: 92%   Filed Weights   12/28/16 0859  Weight: 235 lb 12.8 oz (107 kg)   .Body mass index is 33.83 kg/m.  GENERAL:alert, in no acute distress and comfortable SKIN: skin color, texture, turgor are normal, no rashes or significant lesions EYES: normal, conjunctiva are pink and non-injected, sclera clear OROPHARYNX:no exudate, no erythema and lips, buccal mucosa, and tongue normal  NECK: supple, no JVD,  Healed surgical area over left parotid            no palpable lymphadenopathy in the cervical, axillary or inguinal LUNGS: clear to auscultation with normal respiratory effort HEART: regular rate & rhythm,  no murmurs and no lower extremity edema ABDOMEN: abdomen soft, non-tender, normoactive bowel sounds , no palpable hepato-splenomegaly Musculoskeletal: no cyanosis of digits and no clubbing  PSYCH: alert & oriented x 3 with fluent speech NEURO: no focal motor/sensory deficits  LABORATORY DATA:  I have reviewed the data as listed  . CBC Latest Ref Rng & Units 12/21/2016 08/17/2016 05/02/2016  WBC 4.0 - 10.3 10e3/uL 4.0 4.1 4.6  Hemoglobin 13.0 - 17.1 g/dL 15.9 15.9 15.4  Hematocrit 38.4 - 49.9 % 46.9 46.1 45.3  Platelets 140 - 400 10e3/uL 129(L) 139(L) 147    . CMP Latest Ref Rng & Units 12/21/2016 08/17/2016 05/02/2016  Glucose 70 - 140 mg/dl 95 92 113  BUN 7.0 - 26.0 mg/dL 15.2 13.5 19.2  Creatinine 0.7 - 1.3 mg/dL  1.0 0.9 0.9  Sodium 136 - 145 mEq/L 140 137 142  Potassium 3.5 - 5.1 mEq/L 4.4 4.1 3.7  Chloride 96 - 112 mEq/L - - -  CO2 22 - 29 mEq/L 25 22 25   Calcium 8.4 - 10.4 mg/dL 9.5 9.8 10.1  Total Protein 6.4 - 8.3 g/dL 8.5(H) 8.4(H) 8.1  Total Bilirubin 0.20 - 1.20 mg/dL 0.98 0.95 0.94  Alkaline Phos 40 - 150 U/L 52 58 59  AST 5 - 34 U/L 31 22 26   ALT 0 - 55 U/L 50 28 43   . Lab Results  Component Value Date   LDH 149 12/21/2016    Component     Latest Ref Rng & Units 03/09/2016  LDH     125 - 245 U/L 158  Hep C Virus Ab     0.0 - 0.9 s/co ratio <0.1  Hepatitis B Surface  Ag     Negative Negative  Hep B Core Ab, Tot     Negative Negative        RADIOGRAPHIC STUDIES: I have personally reviewed the radiological images as listed and agreed with the findings in the report. Ct Soft Tissue Neck W Contrast  Result Date: 12/21/2016 CLINICAL DATA:  56 y/o male restaging extranodal MALT lymphoma diagnosed in the left parotid gland January 2018. EXAM: CT NECK WITH CONTRAST TECHNIQUE: Multidetector CT imaging of the neck was performed using the standard protocol following the bolus administration of intravenous contrast. CONTRAST:  154mL ISOVUE-300 IOPAMIDOL (ISOVUE-300) INJECTION 61% in conjunction with contrast enhanced imaging of the chest, abdomen, and pelvis reported separately. COMPARISON:  PET-CT 02/24/2016.  Neck CT 12/28/2004. FINDINGS: Pharynx and larynx: Negative larynx. Mild blunting of the right piriform sinus. Hypopharynx otherwise within normal limits. Asymmetric size and heterogeneity of the right palatine tonsil (series 8, image 37) although no discrete or hyperenhancing tonsillar mass. The size of the left palatine tonsil appears decreased since February and other oropharyngeal contours are within normal limits. Nasopharynx appears normal. Parapharyngeal and retropharyngeal spaces appear normal. Salivary glands: Postoperative changes to both parotid spaces with soft tissue  scarring. Subtotal absence of the right gland. Mild patchy residual parotid parenchyma anteriorly on the left. There is an 18 mm area of heterogeneity along the anterior inferior left parotid space as seen on series 8, image 35 and series 13, image 87. Oval hypodensity within this area might be a trace amount of postoperative fluid or seroma, and this area has improved in appearance since the February PET-CT at which time a larger rounded 2.8 cm area of hypodensity was present. Bilateral submandibular gland atrophy which is chronic, but progressed on the right side since 2006. Negative sublingual space. Thyroid: Negative. Lymph nodes: Bilateral cervical lymph nodes are within normal limits. No abnormally enlarged nodes. No cystic or necrotic nodes. Vascular: Major vascular structures in the neck and at the skullbase are patent. The distal right vertebral artery is dominant. Calcified atherosclerosis at the skull base. Limited intracranial: Negative. Visualized orbits: Negative. Mastoids and visualized paranasal sinuses: Clear. Skeleton: Impacted right mandible wisdom tooth incidentally noted. No acute or suspicious osseous lesions in the neck or at the skullbase. Upper chest: Reported separately today. IMPRESSION: 1. Regressed postoperative changes to the left parotid space since February. Mild residual heterogeneity with no suspicious left parotid space mass. 2. Mild nonspecific asymmetric enlargement and heterogeneity of the right palatine tonsil. No discrete right tonsillar mass. The left tonsillar size appears decreased since February. 3. No lymphadenopathy in the neck. 4. CT Chest, Abdomen, and Pelvis today are reported separately. Electronically Signed   By: Genevie Ann M.D.   On: 12/21/2016 13:31   Ct Chest W Contrast  Result Date: 12/21/2016 CLINICAL DATA:  MALT lymphoma restaging. EXAM: CT CHEST, ABDOMEN, AND PELVIS WITH CONTRAST TECHNIQUE: Multidetector CT imaging of the chest, abdomen and pelvis was  performed following the standard protocol during bolus administration of intravenous contrast. CONTRAST:  130mL ISOVUE-300 IOPAMIDOL (ISOVUE-300) INJECTION 61% COMPARISON:  Multiple exams, including PET-CT from 02/24/2016 FINDINGS: CT CHEST FINDINGS Cardiovascular: Left main coronary artery atherosclerotic calcification. Mediastinum/Nodes: No thoracic adenopathy identified. Lungs/Pleura: Mild emphysema. Airway thickening is present, suggesting bronchitis or reactive airways disease. Sub solid 4 by 3 mm left upper lobe nodule on image 34/6. Sub solid 1.0 by 0.7 cm lingular nodule on image 87/6 with what appears to be at internal air bronchograms, stable from 02/24/2016 but not readily appreciable  on 12/21/2005. 4 mm subpleural nodule in the left lower lobe on image 117/6, no change from 12/21/2005. Similar 0.6 by 0.4 cm left lower lobe nodule on image 104/6, unchanged. Stable 5 mm apical segment nodule in the right upper lobe on image 17/6. Musculoskeletal: Old healed left lateral fifth rib fracture. Thoracic spondylosis. CT ABDOMEN PELVIS FINDINGS Hepatobiliary: Cholecystectomy.  Otherwise unremarkable. Pancreas: Unremarkable Spleen: Unremarkable Adrenals/Urinary Tract: Unremarkable Stomach/Bowel: Unremarkable Vascular/Lymphatic: Right external iliac node 8 mm in short axis on image 106/2, stable. No pathologic adenopathy identified. Reproductive: The prostate gland measures 4.6 by 4.6 by 5.0 cm (volume = 55 cm^3) and mildly indents the bladder base. Several right eccentric prostate calcifications are again observed. Other: No supplemental non-categorized findings. Musculoskeletal: Small umbilical hernia contains adipose tissue. IMPRESSION: 1. No findings of pathologic adenopathy in the chest, abdomen, or pelvis. 2. Several pulmonary nodules are for the most part stable from 2007 and considered benign. There is a sub solid 1.0 by 0.7 cm lingular nodule with internal air bronchogram, stable from 02/24/2016 but not  readily appreciable on 12/21/2005. Attention to this lingular nodule on follow up studies is recommended. 3. Mild prostatomegaly. 4. Left main coronary artery atherosclerotic calcification. Electronically Signed   By: Van Clines M.D.   On: 12/21/2016 13:55   Ct Abdomen Pelvis W Contrast  Result Date: 12/21/2016 CLINICAL DATA:  MALT lymphoma restaging. EXAM: CT CHEST, ABDOMEN, AND PELVIS WITH CONTRAST TECHNIQUE: Multidetector CT imaging of the chest, abdomen and pelvis was performed following the standard protocol during bolus administration of intravenous contrast. CONTRAST:  142mL ISOVUE-300 IOPAMIDOL (ISOVUE-300) INJECTION 61% COMPARISON:  Multiple exams, including PET-CT from 02/24/2016 FINDINGS: CT CHEST FINDINGS Cardiovascular: Left main coronary artery atherosclerotic calcification. Mediastinum/Nodes: No thoracic adenopathy identified. Lungs/Pleura: Mild emphysema. Airway thickening is present, suggesting bronchitis or reactive airways disease. Sub solid 4 by 3 mm left upper lobe nodule on image 34/6. Sub solid 1.0 by 0.7 cm lingular nodule on image 87/6 with what appears to be at internal air bronchograms, stable from 02/24/2016 but not readily appreciable on 12/21/2005. 4 mm subpleural nodule in the left lower lobe on image 117/6, no change from 12/21/2005. Similar 0.6 by 0.4 cm left lower lobe nodule on image 104/6, unchanged. Stable 5 mm apical segment nodule in the right upper lobe on image 17/6. Musculoskeletal: Old healed left lateral fifth rib fracture. Thoracic spondylosis. CT ABDOMEN PELVIS FINDINGS Hepatobiliary: Cholecystectomy.  Otherwise unremarkable. Pancreas: Unremarkable Spleen: Unremarkable Adrenals/Urinary Tract: Unremarkable Stomach/Bowel: Unremarkable Vascular/Lymphatic: Right external iliac node 8 mm in short axis on image 106/2, stable. No pathologic adenopathy identified. Reproductive: The prostate gland measures 4.6 by 4.6 by 5.0 cm (volume = 55 cm^3) and mildly indents  the bladder base. Several right eccentric prostate calcifications are again observed. Other: No supplemental non-categorized findings. Musculoskeletal: Small umbilical hernia contains adipose tissue. IMPRESSION: 1. No findings of pathologic adenopathy in the chest, abdomen, or pelvis. 2. Several pulmonary nodules are for the most part stable from 2007 and considered benign. There is a sub solid 1.0 by 0.7 cm lingular nodule with internal air bronchogram, stable from 02/24/2016 but not readily appreciable on 12/21/2005. Attention to this lingular nodule on follow up studies is recommended. 3. Mild prostatomegaly. 4. Left main coronary artery atherosclerotic calcification. Electronically Signed   By: Van Clines M.D.   On: 12/21/2016 13:55    ASSESSMENT & PLAN:   56 year old generally healthy Caucasian male with previous history of right parotid gland lesion resected in 2003 and monitored with scans  by Dr. Eston Esters now presents with   1) Left Parotid gland slowly growing mass this was resected by Dr. Constance Holster and pathology is consistent with an extranodal marginal Zone lymphoma of the parotid MALT tissue. PET/CT suggest possible residual lesion in the parotid gland. There are a few small upper cervical LN's and uptake in the Waldeyer's ring -  Cervical lymph node biopsy is consistent with MALT lymphoma involvement. After discussing the various treatment options  including observation versus Rituxan monotherapy vs ISRT vs chemo-immunotherapy and the rationale for different treatments. Patient choose to proceed with active surveillance and observation, which is what we would recommend since he does not have much residual MALT lymphoma and had no symptoms attributable to this.  Hepatitis profile neg. Plan -Patient has no clinical or lab evidence of significant lymphoma progression at this time. LDH is within normal limits. -CT chest/abd/pelvis 12/21/2016 -- showed no significant pathologic  LNadenopathy. -He has mild thrombocytopenia which will be monitored -No new lymphadenopathy or hepatosplenomegaly. Rest of his labs are stable. -No new indication for treatment of his indolent lymphoma at this time.  RTC with Dr Irene Limbo in 6 months with labs  All of the patients questions were answered with apparent satisfaction. The patient knows to call the clinic with any problems, questions or concerns.  I spent 20 minutes counseling the patient face to face. The total time spent in the appointment was 25 minutes and more than 50% was on counseling and direct patient cares.    Sullivan Lone MD [place] AAHIVMS [facility] Hematology/Oncology Physician [facility]  (Office):       [phone] (Work cell):  [phone] (Fax):           [phone]

## 2017-01-31 DIAGNOSIS — G4700 Insomnia, unspecified: Secondary | ICD-10-CM | POA: Diagnosis not present

## 2017-01-31 DIAGNOSIS — G252 Other specified forms of tremor: Secondary | ICD-10-CM | POA: Diagnosis not present

## 2017-02-04 ENCOUNTER — Other Ambulatory Visit: Payer: Self-pay

## 2017-02-04 DIAGNOSIS — D485 Neoplasm of uncertain behavior of skin: Secondary | ICD-10-CM | POA: Diagnosis not present

## 2017-05-31 ENCOUNTER — Other Ambulatory Visit: Payer: Self-pay

## 2017-05-31 DIAGNOSIS — D235 Other benign neoplasm of skin of trunk: Secondary | ICD-10-CM | POA: Diagnosis not present

## 2017-05-31 DIAGNOSIS — D225 Melanocytic nevi of trunk: Secondary | ICD-10-CM | POA: Diagnosis not present

## 2017-06-05 DIAGNOSIS — Z0000 Encounter for general adult medical examination without abnormal findings: Secondary | ICD-10-CM | POA: Diagnosis not present

## 2017-06-05 DIAGNOSIS — Z125 Encounter for screening for malignant neoplasm of prostate: Secondary | ICD-10-CM | POA: Diagnosis not present

## 2017-06-05 DIAGNOSIS — Z1159 Encounter for screening for other viral diseases: Secondary | ICD-10-CM | POA: Diagnosis not present

## 2017-06-05 DIAGNOSIS — Z8572 Personal history of non-Hodgkin lymphomas: Secondary | ICD-10-CM | POA: Diagnosis not present

## 2017-06-05 DIAGNOSIS — J309 Allergic rhinitis, unspecified: Secondary | ICD-10-CM | POA: Diagnosis not present

## 2017-06-05 DIAGNOSIS — K219 Gastro-esophageal reflux disease without esophagitis: Secondary | ICD-10-CM | POA: Diagnosis not present

## 2017-06-05 DIAGNOSIS — E785 Hyperlipidemia, unspecified: Secondary | ICD-10-CM | POA: Diagnosis not present

## 2017-06-05 DIAGNOSIS — E6609 Other obesity due to excess calories: Secondary | ICD-10-CM | POA: Diagnosis not present

## 2017-06-21 DIAGNOSIS — R972 Elevated prostate specific antigen [PSA]: Secondary | ICD-10-CM | POA: Diagnosis not present

## 2017-06-27 NOTE — Progress Notes (Signed)
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  HISTORY OF PRESENTING ILLNESS:  Garrett Keller is a wonderful 57 y.o. 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  Patient reports that he had a right parotid mass excised in 2003 but that there was no definitive pathologic diagnosis. He was followed by Dr. Eston Esters from oncology with interval scans but had no new concerns until 2007.  Patient recently presented with a fullness in the left side of the face with a small palpable mass possibly present for several months but was noticed about 2 months ago. He was evaluated by Dr. Constance Holster from ENT on 12/23/2015    He subsequently had a resection of his left parotid mass on 02/01/2016 which showed a MALT lymphoma of the left parotid gland. Patient subsequently had a PET/CT scan on 02/24/2016 which showed Diffuse hypermetabolism throughout [facility] ring which demonstrates diffuse soft tissue thickening (left greater than right). Small adjacent bilateral level IIa lymph nodes demonstrate hypermetabolism, as above. In addition, there is a focal area of soft tissue prominence in the inferior aspect of the left parotid gland immediately inferior to the left external auditory canal extending to the skin surface in the inferior aspect of the left year which demonstrates hypermetabolism. This may represent an additional focus of disease. No evidence of FDG avid lymphadenopathy in the chest or abdomen or focal skeletal lesions .  Patient notes that he has healed well from surgery . He has no specific focal symptoms . No fevers no chills no night sweats no unexpected weight loss . He overall  feels quite well .  INTERVAL HISTORY  Garrett Keller is here for f/u for his MALT lymphoma. The patient's last visit with [place] was on 12/28/16. The pt reports that he is doing well overall.   The pt reports that his PCP is concerned for prostate cancer and he notes that he has had prostatitis and has been biking more. He will be seeing urology soon at [facility].   Lab results today (06/28/17) of CBC, CMP, and Reticulocytes is as follows: all values are WNL except for WBC at 3.5k. LDH 06/28/17 is WNL at 135  On review of systems, pt reports good energy levels, and denies fevers, chills, night sweats, unexpected weight loss, noticing any new lumps or bumps, abdominal pains, and any other symptoms.   MEDICAL HISTORY:  Past Medical History:  Diagnosis Date  . Arthritis   Rt parotid mass excised in 2003 - patient notes there was no definitive diagnosis. Patient was followed by Dr. Truddie Coco until 2007. 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  SURGICAL HISTORY: Past Surgical History:  Procedure Laterality Date  . CHOLECYSTECTOMY  2008  . salvary gland  2003   large gland inside of cheek    SOCIAL HISTORY: Social History   Socioeconomic History  . Marital status: Married    Spouse name: Not on file  . Number of children: Not on file  . Years of education: Not on file  . Highest education  level: Not on file  Occupational History  . Not on file  Social Needs  . Financial resource strain: Not on file  . Food insecurity:    Worry: Not on file    Inability: Not on file  . Transportation needs:    Medical: Not on file    Non-medical: Not on file  Tobacco Use  . Smoking status: Never Smoker  . Smokeless tobacco: Never Used  Substance and Sexual Activity  . Alcohol use: Yes    Comment: weekeneds  . Drug use: No  . Sexual activity:  Not on file  Lifestyle  . Physical activity:    Days per week: Not on file    Minutes per session: Not on file  . Stress: Not on file  Relationships  . Social connections:    Talks on phone: Not on file    Gets together: Not on file    Attends religious service: Not on file    Active member of club or organization: Not on file    Attends meetings of clubs or organizations: Not on file    Relationship status: Not on file  . Intimate partner violence:    Fear of current or ex partner: Not on file    Emotionally abused: Not on file    Physically abused: Not on file    Forced sexual activity: Not on file  Other Topics Concern  . Not on file  Social History Narrative  . Not on file  Has been a smoker but has had a history of exposure to secondhand smoke . 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 ALLERGIES:  is allergic to adhesive [tape].  MEDICATIONS:  Current Outpatient Medications  Medication Sig Dispense Refill  . LORazepam (ATIVAN) 0.5 MG tablet      No current facility-administered medications for this visit.     REVIEW OF SYSTEMS:    A 10+ POINT REVIEW OF SYSTEMS WAS OBTAINED including neurology, dermatology, psychiatry, cardiac, respiratory, lymph, extremities, GI, GU, Musculoskeletal, constitutional, breasts, reproductive, HEENT.  All pertinent positives are noted in the HPI.  All others are negative.   PHYSICAL EXAMINATION: ECOG PERFORMANCE STATUS: 0 - Asymptomatic  . Vitals:   06/28/17 1130  BP: 136/87  Pulse: 62  Resp: 18  Temp: 98.2 F (36.8 C)  SpO2: 98%   Filed Weights   06/28/17 1130  Weight: 242 lb 12.8 oz (110.1 kg)   .Body mass index is 34.84 kg/m.  GENERAL:alert, in no acute distress and comfortable SKIN: no acute rashes, no significant lesions EYES: conjunctiva are pink and non-injected,  sclera anicteric OROPHARYNX: MMM, no exudates, no oropharyngeal erythema or ulceration NECK: supple, no JVD, healed surgical area over left parotid LYMPH:  no palpable lymphadenopathy in the cervical, axillary or inguinal regions LUNGS: clear to auscultation b/l with normal respiratory effort HEART: regular rate & rhythm ABDOMEN:  normoactive bowel sounds , non tender, not distended, no palpable hepatosplenomegaly.  Extremity: no pedal edema PSYCH: alert & oriented x 3 with fluent speech NEURO: no focal motor/sensory deficits   LABORATORY DATA:  I have reviewed the data as listed  . CBC Latest Ref Rng & Units 06/28/2017 12/21/2016 08/17/2016  WBC 4.0 - 10.3 K/uL 3.5(L) 4.0 4.1  Hemoglobin 13.0 - 17.1 g/dL 14.9 15.9 15.9  Hematocrit 38.4 - 49.9 % 43.3 46.9 46.1  Platelets  140 - 400 K/uL 142 129(L) 139(L)    . CMP Latest Ref Rng & Units 06/28/2017 12/21/2016 08/17/2016  Glucose 70 - 140 mg/dL 108 95 92  BUN 7 - 26 mg/dL 21 15.2 13.5  Creatinine 0.70 - 1.30 mg/dL 1.06 1.0 0.9  Sodium 136 - 145 mmol/L 141 140 137  Potassium 3.5 - 5.1 mmol/L 4.1 4.4 4.1  Chloride 98 - 109 mmol/L 107 - -  CO2 22 - 29 mmol/L 23 25 22   Calcium 8.4 - 10.4 mg/dL 9.6 9.5 9.8  Total Protein 6.4 - 8.3 g/dL 8.1 8.5(H) 8.4(H)  Total Bilirubin 0.2 - 1.2 mg/dL 0.8 0.98 0.95  Alkaline Phos 40 - 150 U/L 54 52 58  AST 5 - 34 U/L 20 31 22   ALT 0 - 55 U/L 29 50 28   . Lab Results  Component Value Date   LDH 135 06/28/2017    Component     Latest Ref Rng & Units 03/09/2016  LDH     125 - 245 U/L 158  Hep C Virus Ab     0.0 - 0.9 s/co ratio <0.1  Hepatitis B Surface Ag     Negative Negative  Hep B Core Ab, Tot     Negative Negative        RADIOGRAPHIC STUDIES: I have personally reviewed the radiological images as listed and agreed with the findings in the report. No results found.  ASSESSMENT & PLAN:   57 y.o. generally healthy Caucasian male with previous history of right parotid gland lesion  resected in 2003 and monitored with scans by Dr. Eston Esters now presents with   1) Left Parotid gland slowly growing mass this was resected by Dr. Constance Holster and pathology is consistent with an extranodal marginal Zone lymphoma of the parotid MALT tissue. PET/CT suggest possible residual lesion in the parotid gland. There are a few small upper cervical LN's and uptake in the [facility] ring   Cervical lymph node biopsy is consistent with MALT lymphoma involvement. After discussing the various treatment options  including observation versus Rituxan monotherapy vs ISRT vs chemo-immunotherapy and the rationale for different treatments. Patient choose to proceed with active surveillance and observation, which is what we would recommend since he does not have much residual MALT lymphoma and had no symptoms attributable to this. Hepatitis profile neg. Plan -clinically No new lymphadenopathy or hepatosplenomegaly.  -Discussed pt labwork today, 06/28/17; blood counts and chemistries are stable -The pt shows no clinical or lab progression of recurrence of his MALT lymphoma at this time.  -No indication for further treatment at this time.  -Will repeat CT scans -Continue eating well, sleeping well, and staying active -Will see pt back in 6 months   CT neck/chest/abd in 24 weeks RTC with Dr Irene Limbo in 6 months with labs   All of the patients questions were answered with apparent satisfaction. The patient knows to call the clinic with any problems, questions or concerns.  The toal time spent in the appt was 15 minutes and more than 50% was on counseling and direct patient cares.    Sullivan Lone MD [place] AAHIVMS [facility] Hematology/Oncology Physician [facility]  (Office):       [phone] (Work cell):  [phone] (Fax):           [phone]  I, Baldwin [place], am acting as a scribe for Dr Irene Limbo.   .I have reviewed the above documentation for accuracy and completeness, and I agree with the  above. .Brunetta Genera MD

## 2017-06-28 ENCOUNTER — Inpatient Hospital Stay: Payer: BLUE CROSS/BLUE SHIELD

## 2017-06-28 ENCOUNTER — Inpatient Hospital Stay: Payer: BLUE CROSS/BLUE SHIELD | Attending: Hematology | Admitting: Hematology

## 2017-06-28 ENCOUNTER — Telehealth: Payer: Self-pay | Admitting: Hematology

## 2017-06-28 VITALS — BP 136/87 | HR 62 | Temp 98.2°F | Resp 18 | Ht 70.0 in | Wt 242.8 lb

## 2017-06-28 DIAGNOSIS — C8580 Other specified types of non-Hodgkin lymphoma, unspecified site: Secondary | ICD-10-CM

## 2017-06-28 DIAGNOSIS — Z8572 Personal history of non-Hodgkin lymphomas: Secondary | ICD-10-CM

## 2017-06-28 LAB — COMPREHENSIVE METABOLIC PANEL
ALT: 29 U/L (ref 0–55)
AST: 20 U/L (ref 5–34)
Albumin: 4.1 g/dL (ref 3.5–5.0)
Alkaline Phosphatase: 54 U/L (ref 40–150)
Anion gap: 11 (ref 3–11)
BUN: 21 mg/dL (ref 7–26)
CHLORIDE: 107 mmol/L (ref 98–109)
CO2: 23 mmol/L (ref 22–29)
CREATININE: 1.06 mg/dL (ref 0.70–1.30)
Calcium: 9.6 mg/dL (ref 8.4–10.4)
GFR calc non Af Amer: >60 mL/min (ref >60)
Glucose, Bld: 108 mg/dL (ref 70–140)
Potassium: 4.1 mmol/L (ref 3.5–5.1)
SODIUM: 141 mmol/L (ref 136–145)
Total Bilirubin: 0.8 mg/dL (ref 0.2–1.2)
Total Protein: 8.1 g/dL (ref 6.4–8.3)

## 2017-06-28 LAB — CBC WITH DIFFERENTIAL (CANCER CENTER ONLY)
Basophils Absolute: 0 10*3/uL (ref 0.0–0.1)
Basophils Relative: 0 %
EOS ABS: 0.1 10*3/uL (ref 0.0–0.5)
Eosinophils Relative: 3 %
HEMATOCRIT: 43.3 % (ref 38.4–49.9)
HEMOGLOBIN: 14.9 g/dL (ref 13.0–17.1)
LYMPHS ABS: 1.6 10*3/uL (ref 0.9–3.3)
Lymphocytes Relative: 44 %
MCH: 30.6 pg (ref 27.2–33.4)
MCHC: 34.4 g/dL (ref 32.0–36.0)
MCV: 88.9 fL (ref 79.3–98.0)
MONOS PCT: 11 %
Monocytes Absolute: 0.4 10*3/uL (ref 0.1–0.9)
NEUTROS PCT: 42 %
Neutro Abs: 1.5 10*3/uL (ref 1.5–6.5)
Platelet Count: 142 10*3/uL (ref 140–400)
RBC: 4.87 MIL/uL (ref 4.20–5.82)
RDW: 13 % (ref 11.0–14.6)
WBC Count: 3.5 10*3/uL — ABNORMAL LOW (ref 4.0–10.3)

## 2017-06-28 LAB — RETICULOCYTES
RBC.: 4.87 MIL/uL (ref 4.20–5.82)
Retic Count, Absolute: 53.6 10*3/uL (ref 34.8–93.9)
Retic Ct Pct: 1.1 % (ref 0.8–1.8)

## 2017-06-28 LAB — LACTATE DEHYDROGENASE: LDH: 135 U/L (ref 125–245)

## 2017-06-28 NOTE — Telephone Encounter (Signed)
Scheduled appt per 6/7 los - Gave patient AVS and calender per los. 

## 2017-07-11 IMAGING — CT NM PET TUM IMG INITIAL (PI) SKULL BASE T - THIGH
1 of 9 series · 1 of 25 positions shown · non-contrast
Comparison: CT the chest, abdomen and pelvis 12/21/2005.

CLINICAL DATA: Initial treatment strategy for MALT lymphoma.

EXAM:
NUCLEAR MEDICINE PET SKULL BASE TO THIGH
TECHNIQUE: 11.42 mCi F-18 FDG was injected intravenously. Full-ring PET imaging
was performed from the skull base to thigh after the radiotracer. CT
data was obtained and used for attenuation correction and anatomic
localization.
FASTING BLOOD GLUCOSE:  Value: 105 mg/dl

[Series 4: ct [id number] 5.0 b31f · axial · 5.0mm · 0.98mm/px · 1 of 241 slices shown]
[im 241/241  brain]
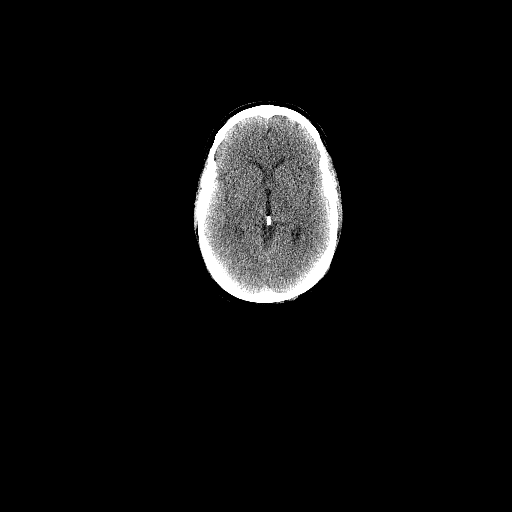

[1 of 25 positions shown; findings below may reference images not displayed]

FINDINGS: NECK

Thickening of the lymphoid tissue in Waldeyer's ring, asymmetric
(left greater than right), which demonstrates diffuse
hypermetabolism (SUVmax = 9.2) Right-sided level IIa lymph node
measuring 8 mm in short axis (image 30 of series 4) is
hypermetabolic (SUVmax = 6.0 on). Likewise, there is a 7 mm short
axis left level IIa lymph node (image 20 of series 4), which is also
mildly hypermetabolic (SUVmax = 4.3). The soft tissues lateral to
the left side of the angle of the mandible there is a
well-circumscribed 2.5 cm low-attenuation lesion which does not
demonstrate internal hypermetabolism, which is presumably the cyst
in the inferior aspect of the parotid gland. Posterior and cephalad
to this in the superficial soft tissues of the parotid gland
immediately inferior to the external auditory canal along the
inferior aspect of the ear there is a focal 2.4 x 1.4 cm soft tissue
attenuation lesion (image 17 of series 4) which demonstrates
hypermetabolism (SUVmax = 8.9).

CHEST

No pathologically enlarged hypermetabolic mediastinal or hilar lymph
nodes. No suspicious appearing hypermetabolic pulmonary nodules or
masses. No acute consolidative airspace disease. No pleural
effusions. Scattered lung cysts are incidentally noted. Heart size
is normal. There is no significant pericardial fluid, thickening or
pericardial calcification. There is aortic atherosclerosis, as well
as atherosclerosis of the great vessels of the mediastinum and the
coronary arteries, including calcified atherosclerotic plaque in the
left main and left anterior descending coronary arteries.

ABDOMEN/PELVIS

No abnormal hypermetabolic activity within the liver, pancreas,
adrenal glands, or spleen. No hypermetabolic lymph nodes in the
abdomen or pelvis. Status post cholecystectomy. Normal appendix. No
pathologic dilatation of small bowel or colon.

SKELETON

There multiple focal areas of mild increased metabolic activity
throughout the visualized axial and appendicular skeleton, however,
none of these correspond to suspicious appearing lytic or blastic
lesions on the CT portion of the examination, and this is presumably
indicative of heterogeneous normal physiologic marrow activity.
IMPRESSION: 1. Diffuse hypermetabolism throughout Waldeyer's ring which
demonstrates diffuse soft tissue thickening (left greater than
right). Small adjacent bilateral level IIa lymph nodes demonstrate
hypermetabolism, as above.
2. In addition, there is a focal area of soft tissue prominence in
the inferior aspect of the left parotid gland immediately inferior
to the left external auditory canal extending to the skin surface in
the inferior aspect of the left year which demonstrates
hypermetabolism. This may represent an additional focus of disease.
3. Aortic atherosclerosis, in addition to left main and left
anterior descending coronary artery disease. Please note that
although the presence of coronary artery calcium documents the
presence of coronary artery disease, the severity of this disease
and any potential stenosis cannot be assessed on this non-gated CT
examination. Assessment for potential risk factor modification,
dietary therapy or pharmacologic therapy may be warranted, if
clinically indicated.
4. Additional incidental findings, as above.

## 2017-12-13 ENCOUNTER — Ambulatory Visit (HOSPITAL_COMMUNITY)
Admission: RE | Admit: 2017-12-13 | Discharge: 2017-12-13 | Disposition: A | Discharge status: Home / Self Care | Payer: BLUE CROSS/BLUE SHIELD | Source: Ambulatory Visit | Attending: Hematology | Admitting: Hematology

## 2017-12-13 ENCOUNTER — Encounter (HOSPITAL_COMMUNITY): Payer: Self-pay

## 2017-12-13 DIAGNOSIS — C8580 Other specified types of non-Hodgkin lymphoma, unspecified site: Secondary | ICD-10-CM | POA: Insufficient documentation

## 2017-12-13 DIAGNOSIS — K7689 Other specified diseases of liver: Secondary | ICD-10-CM | POA: Diagnosis not present

## 2017-12-13 DIAGNOSIS — C884 Extranodal marginal zone B-cell lymphoma of mucosa-associated lymphoid tissue [MALT-lymphoma]: Secondary | ICD-10-CM | POA: Diagnosis not present

## 2017-12-13 DIAGNOSIS — J432 Centrilobular emphysema: Secondary | ICD-10-CM | POA: Diagnosis not present

## 2017-12-13 MED ORDER — SODIUM CHLORIDE (PF) 0.9 % IJ SOLN
INTRAMUSCULAR | Status: AC
  Filled 2017-12-13: qty 50

## 2017-12-13 MED ORDER — IOHEXOL 300 MG/ML  SOLN
100.0000 mL | Freq: Once | INTRAMUSCULAR | Status: AC | PRN
  Administered 2017-12-13: 100 mL via INTRAVENOUS

## 2017-12-26 NOTE — Progress Notes (Signed)
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  HISTORY OF PRESENTING ILLNESS:  Garrett Keller is a wonderful 57 y.o. 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  Patient reports that he had a right parotid mass excised in 2003 but that there was no definitive pathologic diagnosis. He was followed by Dr. Eston Esters from oncology with interval scans but had no new concerns until 2007.  Patient recently presented with a fullness in the left side of the face with a small palpable mass possibly present for several months but was noticed about 2 months ago. He was evaluated by Dr. Constance Holster from ENT on 12/23/2015    He subsequently had a resection of his left parotid mass on 02/01/2016 which showed a MALT lymphoma of the left parotid gland. Patient subsequently had a PET/CT scan on 02/24/2016 which showed Diffuse hypermetabolism throughout [facility] ring which demonstrates diffuse soft tissue thickening (left greater than right). Small adjacent bilateral level IIa lymph nodes demonstrate hypermetabolism, as above. In addition, there is a focal area of soft tissue prominence in the inferior aspect of the left parotid gland immediately inferior to the left external auditory canal extending to the skin surface in the inferior aspect of the left year which demonstrates hypermetabolism. This may represent an additional focus of disease. No evidence of FDG avid lymphadenopathy in the chest or abdomen or focal skeletal lesions .  Patient notes that he has healed well from surgery . He has no specific focal symptoms . No fevers no chills no night sweats no unexpected weight loss . He overall  feels quite well .  INTERVAL HISTORY  Garrett Keller is here for f/u for his MALT lymphoma. The patient's last visit with [place] was on 06/28/17. The pt reports that he is doing well overall.   The pt reports that he has not developed any new concerns in the interim. He denies noticing any new lumps or bumps, and also denies fevers, chills, night sweats, or unexpected weight loss. The pt has continued to stay active with swimming.   The pt notes that he has never been a smoker, but notes that his mother was a smoker.  The pt notes that he has noticed his hands have been shaking in the last year. He notes that he only experiences fine tremors when trying to do something with his hands, and denies tremors at rest. He notes worse after alcohol use, but notes that his fine tremors are stable during his alcohol use. He notes that he first began noticing this about a year ago, while drinking coffee. He notes that his fine tremors are getting worse over the last year. The pt notes that he will be returning to care with his PCP on Monday 12/30/17 and plans to ask for a referral to a neurologist then.  Of note since the patient's last visit, pt has had a CT C/A/P completed on 12/13/17 with results revealing A right upper lobe ground-glass density pulmonary nodule has significantly enlarged over the past year, currently measuring 1.2 by 0.9 cm and formerly 0.5 by 0.7 cm. This slow but progressive growth raises concern for potential low-grade adenocarcinoma. Consider discussion in a  multidisciplinary cancer conference setting. Possible further workup might include close surveillance CT imaging, tissue diagnosis, or nuclear medicine PET-CT. 2. Minimal enlargement of a small focus of lingular architectural distortion and ground-glass nodularity, currently 1.2 by 1.0 cm. 3. The other small solid and sub solid pulmonary nodules appear Stable. 4. No current adenopathy. 5.  Aortic Atherosclerosis. Coronary atherosclerosis. 6.  Moderate prostatomegaly.  The pt also had a 12/13/17 CT Neck which revealed No recurrent parotid mass or cervical adenopathy. No thickening of Waldeyer's ring. 2. Generalized salivary atrophy with progressive dilatation of the left parotid duct.  Lab results today (12/27/17) of CBC w/diff and CMP is as follows: all values are WNL except for WBC at 3.7k, PLT at 127k, ANC at 1.6k, Glucose at 102. 12/27/17 LDH is . Lab Results  Component Value Date   LDH 143 12/27/2017   On review of systems, pt reports stable weight, staying active, fine hand tremors, eating well, good energy levels, and denies fevers, chills, night sweats, unexpected weight loss, noticing any new lumps or bumps, pain along the spine, pain at previous surgical incision site, abdominal pains, leg swelling, and any other symptoms.    MEDICAL HISTORY:  Past Medical History:  Diagnosis Date  . Arthritis   Rt parotid mass excised in 2003 - patient notes there was no definitive diagnosis. Patient was followed by Dr. Truddie Coco until 2007. 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  SURGICAL HISTORY: Past Surgical History:  Procedure Laterality Date  . CHOLECYSTECTOMY  2008  . salvary gland  2003   large gland inside of cheek    SOCIAL HISTORY: Social History   Socioeconomic History  . Marital status: Married    Spouse name: Not on file  . Number of children: Not on file  . Years of education: Not on file  . Highest education level: Not on file  Occupational History  . Not on file  Social Needs  . Financial resource strain: Not on file  . Food insecurity:    Worry: Not on file    Inability: Not on file  . Transportation needs:    Medical: Not on file    Non-medical: Not on file  Tobacco Use  . Smoking status: Never Smoker  . Smokeless tobacco: Never Used    Substance and Sexual Activity  . Alcohol use: Yes    Comment: weekeneds  . Drug use: No  . Sexual activity: Not on file  Lifestyle  . Physical activity:    Days per week: Not on file    Minutes per session: Not on file  . Stress: Not on file  Relationships  . Social connections:    Talks on phone: Not on file    Gets together: Not on file    Attends religious service: Not on file    Active member of club or organization: Not on file    Attends meetings of clubs or organizations: Not on file    Relationship status: Not on file  . Intimate partner violence:    Fear of current or ex partner: Not on file    Emotionally abused: Not on file    Physically abused: Not on file    Forced sexual activity: Not on file  Other Topics Concern  . Not on file  Social History Narrative  . Not on file  Has  been a smoker but has had a history of exposure to secondhand smoke . 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 ALLERGIES:  is allergic to adhesive [tape].  MEDICATIONS:  Current Outpatient Medications  Medication Sig Dispense Refill  . LORazepam (ATIVAN) 0.5 MG tablet      No current facility-administered medications for this visit.     REVIEW OF SYSTEMS:    A 10+ POINT REVIEW OF SYSTEMS WAS OBTAINED including neurology, dermatology, psychiatry, cardiac, respiratory, lymph, extremities, GI, GU, Musculoskeletal, constitutional, breasts, reproductive, HEENT.  All pertinent positives are noted in the HPI.  All others are negative.    PHYSICAL EXAMINATION: ECOG PERFORMANCE STATUS: 0 - Asymptomatic  . Vitals:   12/27/17 0949  BP: 133/78  Pulse: 63  Resp: 18  Temp: 97.8 F (36.6 C)  SpO2: 97%   Filed Weights   12/27/17 0949  Weight: 237 lb 6.4 oz (107.7 kg)   .Body mass index is 34.06 kg/m.  GENERAL:alert, in no acute distress  and comfortable SKIN: no acute rashes, no significant lesions EYES: conjunctiva are pink and non-injected, sclera anicteric OROPHARYNX: MMM, no exudates, no oropharyngeal erythema or ulceration NECK: supple, no JVD LYMPH:  no palpable lymphadenopathy in the cervical, axillary or inguinal regions LUNGS: clear to auscultation b/l with normal respiratory effort HEART: regular rate & rhythm ABDOMEN:  normoactive bowel sounds , non tender, not distended. No palpable hepatosplenomegaly.  Extremity: no pedal edema PSYCH: alert & oriented x 3 with fluent speech NEURO: no focal motor/sensory deficits   LABORATORY DATA:  I have reviewed the data as listed  . CBC Latest Ref Rng & Units 12/27/2017 06/28/2017 12/21/2016  WBC 4.0 - 10.5 K/uL 3.7(L) 3.5(L) 4.0  Hemoglobin 13.0 - 17.0 g/dL 15.5 14.9 15.9  Hematocrit 39.0 - 52.0 % 45.8 43.3 46.9  Platelets 150 - 400 K/uL 127(L) 142 129(L)    . CMP Latest Ref Rng & Units 12/27/2017 06/28/2017 12/21/2016  Glucose 70 - 99 mg/dL 102(H) 108 95  BUN 6 - 20 mg/dL 13 21 15.2  Creatinine 0.61 - 1.24 mg/dL 0.98 1.06 1.0  Sodium 135 - 145 mmol/L 141 141 140  Potassium 3.5 - 5.1 mmol/L 4.3 4.1 4.4  Chloride 98 - 111 mmol/L 107 107 -  CO2 22 - 32 mmol/L 24 23 25   Calcium 8.9 - 10.3 mg/dL 9.4 9.6 9.5  Total Protein 6.5 - 8.1 g/dL 8.0 8.1 8.5(H)  Total Bilirubin 0.3 - 1.2 mg/dL 1.0 0.8 0.98  Alkaline Phos 38 - 126 U/L 55 54 52  AST 15 - 41 U/L 23 20 31   ALT 0 - 44 U/L 38 29 50   . Lab Results  Component Value Date   LDH 135 06/28/2017    Component     Latest Ref Rng & Units 03/09/2016  LDH     125 - 245 U/L 158  Hep C Virus Ab     0.0 - 0.9 s/co ratio <0.1  Hepatitis B Surface Ag     Negative Negative  Hep B Core Ab, Tot     Negative Negative        RADIOGRAPHIC STUDIES: I have personally reviewed the radiological images as listed and agreed with the findings in the report. Ct Soft Tissue Neck W Contrast  Result Date: 12/13/2017 CLINICAL  DATA:  Marginal zone lymphoma, follow-up EXAM: CT  NECK WITH CONTRAST TECHNIQUE: Multidetector CT imaging of the neck was performed using the standard protocol following the bolus administration of intravenous contrast. CONTRAST:  170mL OMNIPAQUE IOHEXOL 300 MG/ML  SOLN COMPARISON:  None. FINDINGS: Pharynx and larynx: No thickening of Waldeyer's ring. There are tonsilliths present. Negative for mucosal mass. Salivary glands: Reticulated high-density in the postoperative superficial left parotid with calcifications, a scar-like appearance. There is progressive dilatation of the left parotid duct without underlying calcification. Fatty atrophy of the right parotid and bilateral submandibular glands, seen since at least 2005. Thyroid: Negative. Lymph nodes: No enlarged or enlarging lymph nodes Vascular: Minor atherosclerotic calcification Limited intracranial: Negative Visualized orbits: Negative Mastoids and visualized paranasal sinuses: Clear Skeleton: No acute or aggressive finding.  Spondylosis. Upper chest: Negative IMPRESSION: 1. No recurrent parotid mass or cervical adenopathy. No thickening of Waldeyer's ring. 2. Generalized salivary atrophy with progressive dilatation of the left parotid duct. Electronically Signed   By: Monte Fantasia M.D.   On: 12/13/2017 14:19   Ct Chest W Contrast  Result Date: 12/13/2017 CLINICAL DATA:  Non-Hodgkin's lymphoma recurrence in 2017. Restaging assessment. EXAM: CT CHEST, ABDOMEN, AND PELVIS WITH CONTRAST TECHNIQUE: Multidetector CT imaging of the chest, abdomen and pelvis was performed following the standard protocol during bolus administration of intravenous contrast. CONTRAST:  128mL OMNIPAQUE IOHEXOL 300 MG/ML  SOLN COMPARISON:  12/21/2016 FINDINGS: CT CHEST FINDINGS Cardiovascular: Aortic arch and left anterior descending coronary artery atherosclerotic calcifications. Mediastinum/Nodes: Speckled anterior mediastinal density is stable and likely represents residual  thymic tissue. Small axillary lymph nodes have thin parenchymal margins and fatty hila. No pathologic adenopathy or mass like appearance in the mediastinum. Lungs/Pleura: A right upper lobe ground-glass density pulmonary nodule measures 1.2 by 0.9 cm, formerly 0.5 by 0.7 cm. There is an adjacent 0.8 by 0.5 cm ground-glass density nodule with increasing conspicuity and size as well on image 45/4. In the lingula there is a 1.2 by 1.0 cm focus of architectural distortion and ground-glass opacity on image 99/4, by my measurements this was previously 1.0 by 0.9 cm. Stable 5 mm left lower lobe nodule on image 132/4. Paraseptal emphysema. Stable irregular 5 mm right upper lobe nodule on image 28/4. Stable 4 mm ground-glass density left upper lobe nodule on image 45/4. Several small solid and sub solid left lower lobe nodules on image 119/4 are stable. Musculoskeletal: Old healed left lateral fifth rib fracture. Thoracic spondylosis. CT ABDOMEN PELVIS FINDINGS Hepatobiliary: Cholecystectomy. Pancreas: Unremarkable Spleen: Unremarkable Adrenals/Urinary Tract: Unremarkable Stomach/Bowel: Unremarkable Vascular/Lymphatic: Unremarkable Reproductive: The prostate gland measures 5.1 by 5.1 by 4.9 cm (volume = 67 cm^3) and indents the bladder base. Other: Small mesenteric lymph nodes do not appear pathologically enlarged. Musculoskeletal: Unremarkable IMPRESSION: 1. A right upper lobe ground-glass density pulmonary nodule has significantly enlarged over the past year, currently measuring 1.2 by 0.9 cm and formerly 0.5 by 0.7 cm. This slow but progressive growth raises concern for potential low-grade adenocarcinoma. Consider discussion in a multidisciplinary cancer conference setting. Possible further workup might include close surveillance CT imaging, tissue diagnosis, or nuclear medicine PET-CT. 2. Minimal enlargement of a small focus of lingular architectural distortion and ground-glass nodularity, currently 1.2 by 1.0 cm. 3. The  other small solid and sub solid pulmonary nodules appear stable. 4. No current adenopathy. 5.  Aortic Atherosclerosis (ICD10-I70.0).  Coronary atherosclerosis. 6. Moderate prostatomegaly. Electronically Signed   By: Van Clines M.D.   On: 12/13/2017 14:21   Ct Abdomen Pelvis W Contrast  Result Date: 12/13/2017 CLINICAL DATA:  Non-Hodgkin's lymphoma recurrence in 2017. Restaging assessment. EXAM: CT CHEST, ABDOMEN, AND PELVIS WITH CONTRAST TECHNIQUE: Multidetector CT imaging of the chest, abdomen and pelvis was performed following the standard protocol during bolus administration of intravenous contrast. CONTRAST:  143mL OMNIPAQUE IOHEXOL 300 MG/ML  SOLN COMPARISON:  12/21/2016 FINDINGS: CT CHEST FINDINGS Cardiovascular: Aortic arch and left anterior descending coronary artery atherosclerotic calcifications. Mediastinum/Nodes: Speckled anterior mediastinal density is stable and likely represents residual thymic tissue. Small axillary lymph nodes have thin parenchymal margins and fatty hila. No pathologic adenopathy or mass like appearance in the mediastinum. Lungs/Pleura: A right upper lobe ground-glass density pulmonary nodule measures 1.2 by 0.9 cm, formerly 0.5 by 0.7 cm. There is an adjacent 0.8 by 0.5 cm ground-glass density nodule with increasing conspicuity and size as well on image 45/4. In the lingula there is a 1.2 by 1.0 cm focus of architectural distortion and ground-glass opacity on image 99/4, by my measurements this was previously 1.0 by 0.9 cm. Stable 5 mm left lower lobe nodule on image 132/4. Paraseptal emphysema. Stable irregular 5 mm right upper lobe nodule on image 28/4. Stable 4 mm ground-glass density left upper lobe nodule on image 45/4. Several small solid and sub solid left lower lobe nodules on image 119/4 are stable. Musculoskeletal: Old healed left lateral fifth rib fracture. Thoracic spondylosis. CT ABDOMEN PELVIS FINDINGS Hepatobiliary: Cholecystectomy. Pancreas:  Unremarkable Spleen: Unremarkable Adrenals/Urinary Tract: Unremarkable Stomach/Bowel: Unremarkable Vascular/Lymphatic: Unremarkable Reproductive: The prostate gland measures 5.1 by 5.1 by 4.9 cm (volume = 67 cm^3) and indents the bladder base. Other: Small mesenteric lymph nodes do not appear pathologically enlarged. Musculoskeletal: Unremarkable IMPRESSION: 1. A right upper lobe ground-glass density pulmonary nodule has significantly enlarged over the past year, currently measuring 1.2 by 0.9 cm and formerly 0.5 by 0.7 cm. This slow but progressive growth raises concern for potential low-grade adenocarcinoma. Consider discussion in a multidisciplinary cancer conference setting. Possible further workup might include close surveillance CT imaging, tissue diagnosis, or nuclear medicine PET-CT. 2. Minimal enlargement of a small focus of lingular architectural distortion and ground-glass nodularity, currently 1.2 by 1.0 cm. 3. The other small solid and sub solid pulmonary nodules appear stable. 4. No current adenopathy. 5.  Aortic Atherosclerosis (ICD10-I70.0).  Coronary atherosclerosis. 6. Moderate prostatomegaly. Electronically Signed   By: Van Clines M.D.   On: 12/13/2017 14:21    ASSESSMENT & PLAN:   57 y.o. generally healthy Caucasian male with previous history of right parotid gland lesion resected in 2003 and monitored with scans by Dr. Eston Esters now presents with   1) Left Parotid gland slowly growing mass this was resected by Dr. Constance Holster and pathology is consistent with an extranodal marginal Zone lymphoma of the parotid MALT tissue. PET/CT suggest possible residual lesion in the parotid gland. There are a few small upper cervical LN's and uptake in the [facility] ring   Cervical lymph node biopsy is consistent with MALT lymphoma involvement. After discussing the various treatment options  including observation versus Rituxan monotherapy vs ISRT vs chemo-immunotherapy and the rationale for  different treatments. Patient choose to proceed with active surveillance and observation, which is what we would recommend since he does not have much residual MALT lymphoma and had no symptoms attributable to this. Hepatitis profile neg.   2) Pulmonary nodule  PLAN:  -Discussed pt labwork today, 12/27/17; mild neutropenia with ANC at 1.6k, blood chemistries are stable.  -Discussed the 12/13/17 CT C/A/P which revealed A right upper lobe ground-glass density pulmonary nodule has significantly  enlarged over the past year, currently measuring 1.2 by 0.9 cm and formerly 0.5 by 0.7 cm. This slow but progressive growth raises concern for potential low-grade adenocarcinoma. Consider discussion in a multidisciplinary cancer conference setting. Possible further workup might include close surveillance CT imaging, tissue diagnosis, or nuclear medicine PET-CT. 2. Minimal enlargement of a small focus of lingular architectural distortion and ground-glass nodularity, currently 1.2 by 1.0 cm. 3. The other small solid and sub solid pulmonary nodules appear Stable. 4. No current adenopathy. 5.  Aortic Atherosclerosis.  Coronary atherosclerosis. 6. Moderate prostatomegaly. -Discussed the 12/13/17 CT Neck which revealed No recurrent parotid mass or cervical adenopathy. No thickening of Waldeyer's ring. 2. Generalized salivary atrophy with progressive dilatation of the left parotid duct. -The pt shows no clinical or lab progression/returns of MALT lymphoma at this time.  -No indication for further treatment of MALT lymphoma at this time.   -The pt has never smoked but notes his mother was a smoker -Discussed the enhanced pulmonary nodule seen on the 12/13/17 CT Chest, and recommended keeping a closer eye on this with a repeat PET/CT in 11 weeks vs referring the patient to a cardiothoracic surgeon now for consideration of a biopsy. The pt prefers to watch this again with a PET/CT first.  -RTC with PCP on Monday 12/30/17 and  discuss neurology referral. Recommend TSH be evaluated as well.  -Will see the pt back in 12 weeks with PET/CT   PET/CT in 11 weeks RTC with Dr Irene Limbo in 12 weeks with labs    All of the patients questions were answered with apparent satisfaction. The patient knows to call the clinic with any problems, questions or concerns.  The total time spent in the appt was 25 minutes and more than 50% was on counseling and direct patient cares.    Sullivan Lone MD [place] AAHIVMS [facility] Hematology/Oncology Physician [facility]  (Office):       [phone] (Work cell):  [phone] (Fax):           [phone]  I, Baldwin Jamaica, am acting as a scribe for Dr. Sullivan Lone.   .I have reviewed the above documentation for accuracy and completeness, and I agree with the above. Brunetta Genera MD

## 2017-12-27 ENCOUNTER — Inpatient Hospital Stay: Payer: BLUE CROSS/BLUE SHIELD | Attending: Hematology | Admitting: Hematology

## 2017-12-27 ENCOUNTER — Telehealth: Payer: Self-pay | Admitting: Hematology

## 2017-12-27 ENCOUNTER — Inpatient Hospital Stay: Payer: BLUE CROSS/BLUE SHIELD

## 2017-12-27 VITALS — BP 133/78 | HR 63 | Temp 97.8°F | Resp 18 | Ht 70.0 in | Wt 237.4 lb

## 2017-12-27 DIAGNOSIS — D709 Neutropenia, unspecified: Secondary | ICD-10-CM | POA: Insufficient documentation

## 2017-12-27 DIAGNOSIS — C8580 Other specified types of non-Hodgkin lymphoma, unspecified site: Secondary | ICD-10-CM

## 2017-12-27 DIAGNOSIS — R918 Other nonspecific abnormal finding of lung field: Secondary | ICD-10-CM

## 2017-12-27 DIAGNOSIS — C884 Extranodal marginal zone B-cell lymphoma of mucosa-associated lymphoid tissue [MALT-lymphoma]: Secondary | ICD-10-CM | POA: Diagnosis not present

## 2017-12-27 DIAGNOSIS — R911 Solitary pulmonary nodule: Secondary | ICD-10-CM | POA: Diagnosis not present

## 2017-12-27 LAB — CBC WITH DIFFERENTIAL/PLATELET
ABS IMMATURE GRANULOCYTES: 0 10*3/uL (ref 0.00–0.07)
BASOS ABS: 0 10*3/uL (ref 0.0–0.1)
BASOS PCT: 0 %
EOS ABS: 0.1 10*3/uL (ref 0.0–0.5)
Eosinophils Relative: 3 %
HCT: 45.8 % (ref 39.0–52.0)
Hemoglobin: 15.5 g/dL (ref 13.0–17.0)
IMMATURE GRANULOCYTES: 0 %
Lymphocytes Relative: 40 %
Lymphs Abs: 1.5 10*3/uL (ref 0.7–4.0)
MCH: 29.7 pg (ref 26.0–34.0)
MCHC: 33.8 g/dL (ref 30.0–36.0)
MCV: 87.7 fL (ref 80.0–100.0)
Monocytes Absolute: 0.6 10*3/uL (ref 0.1–1.0)
Monocytes Relative: 15 %
NEUTROS ABS: 1.6 10*3/uL — AB (ref 1.7–7.7)
NEUTROS PCT: 42 %
NRBC: 0 % (ref 0.0–0.2)
PLATELETS: 127 10*3/uL — AB (ref 150–400)
RBC: 5.22 MIL/uL (ref 4.22–5.81)
RDW: 12.7 % (ref 11.5–15.5)
WBC: 3.7 10*3/uL — ABNORMAL LOW (ref 4.0–10.5)

## 2017-12-27 LAB — CMP (CANCER CENTER ONLY)
ALK PHOS: 55 U/L (ref 38–126)
ALT: 38 U/L (ref 0–44)
AST: 23 U/L (ref 15–41)
Albumin: 4 g/dL (ref 3.5–5.0)
Anion gap: 10 (ref 5–15)
BILIRUBIN TOTAL: 1 mg/dL (ref 0.3–1.2)
BUN: 13 mg/dL (ref 6–20)
CHLORIDE: 107 mmol/L (ref 98–111)
CO2: 24 mmol/L (ref 22–32)
CREATININE: 0.98 mg/dL (ref 0.61–1.24)
Calcium: 9.4 mg/dL (ref 8.9–10.3)
Glucose, Bld: 102 mg/dL — ABNORMAL HIGH (ref 70–99)
Potassium: 4.3 mmol/L (ref 3.5–5.1)
Sodium: 141 mmol/L (ref 135–145)
Total Protein: 8 g/dL (ref 6.5–8.1)

## 2017-12-27 LAB — LACTATE DEHYDROGENASE: LDH: 143 U/L (ref 98–192)

## 2017-12-27 NOTE — Telephone Encounter (Signed)
Gave patient avs report and appointments for February. Central radiology will call re scan.  °

## 2017-12-30 ENCOUNTER — Telehealth: Payer: Self-pay | Admitting: *Deleted

## 2017-12-30 DIAGNOSIS — J309 Allergic rhinitis, unspecified: Secondary | ICD-10-CM | POA: Diagnosis not present

## 2017-12-30 DIAGNOSIS — E6609 Other obesity due to excess calories: Secondary | ICD-10-CM | POA: Diagnosis not present

## 2017-12-30 DIAGNOSIS — F419 Anxiety disorder, unspecified: Secondary | ICD-10-CM | POA: Diagnosis not present

## 2017-12-30 DIAGNOSIS — K219 Gastro-esophageal reflux disease without esophagitis: Secondary | ICD-10-CM | POA: Diagnosis not present

## 2017-12-30 NOTE — Telephone Encounter (Signed)
Late entry for 12/27/17: patient left VM stating that he did not think the CT results given to him by Dr. Irene Limbo were his. Specific concerns were noted:  old healed rib fracture and paraseptal emphysema, which he stated he had never had. He asked for Dr. Irene Limbo to be notified of his concerns. Dr. Irene Limbo informed. Attempted to contact patient at number given [phone] to let him know that Dr. Irene Limbo had been informed. No answer and unable to leave message.

## 2018-01-02 ENCOUNTER — Ambulatory Visit (INDEPENDENT_AMBULATORY_CARE_PROVIDER_SITE_OTHER): Payer: BLUE CROSS/BLUE SHIELD | Admitting: Cardiovascular Disease

## 2018-01-02 ENCOUNTER — Encounter: Payer: Self-pay | Admitting: Cardiovascular Disease

## 2018-01-02 VITALS — BP 136/80 | HR 65 | Ht 70.0 in | Wt 237.0 lb

## 2018-01-02 DIAGNOSIS — Z5181 Encounter for therapeutic drug level monitoring: Secondary | ICD-10-CM

## 2018-01-02 DIAGNOSIS — R0609 Other forms of dyspnea: Secondary | ICD-10-CM

## 2018-01-02 DIAGNOSIS — E785 Hyperlipidemia, unspecified: Secondary | ICD-10-CM | POA: Diagnosis not present

## 2018-01-02 DIAGNOSIS — K219 Gastro-esophageal reflux disease without esophagitis: Secondary | ICD-10-CM

## 2018-01-02 DIAGNOSIS — E7800 Pure hypercholesterolemia, unspecified: Secondary | ICD-10-CM

## 2018-01-02 DIAGNOSIS — I2510 Atherosclerotic heart disease of native coronary artery without angina pectoris: Secondary | ICD-10-CM | POA: Diagnosis not present

## 2018-01-02 DIAGNOSIS — R0600 Dyspnea, unspecified: Secondary | ICD-10-CM

## 2018-01-02 HISTORY — DX: Atherosclerotic heart disease of native coronary artery without angina pectoris: I25.10

## 2018-01-02 HISTORY — DX: Other forms of dyspnea: R06.09

## 2018-01-02 HISTORY — DX: Dyspnea, unspecified: R06.00

## 2018-01-02 HISTORY — DX: Pure hypercholesterolemia, unspecified: E78.00

## 2018-01-02 MED ORDER — ATORVASTATIN CALCIUM 20 MG PO TABS: 20.0000 mg | ORAL_TABLET | Freq: Every day | ORAL | 1 refills | Status: DC

## 2018-01-02 NOTE — Progress Notes (Signed)
Cardiology Office Note   Date:  01/02/2018   ID:  Garrett Keller, DOB September 17, 1960, MRN [id number]  PCP:  Garrett Lass, MD  Cardiologist:   Garrett Latch, MD   No chief complaint on file.    History of Present Illness: Garrett Keller is a 57 y.o. male with coronary calcification on chest CT, hyperlipidemia, prior MALT lymphoma, and GERD who is being seen today for the evaluation of shortness of breath at the request of Garrett Lass, MD.  Garrett Keller has a history of MALT lymphoma  He was recently diagnosed with MALT lymphoma.  He initially underwent resection of the right parotid in 2003.  In 2017 he had a mass removed from his left neck and several lymph nodes removed.  He has not required any chemotherapy or radiation.  He follows with Dr. Irene Keller and was noted to have some small lymphadenopathy that they are monitoring on chest CT.  He has a PET scan upcoming.  On his last chest CT he was noted to have atherosclerosis of the aorta and the LAD.  He is quite active.  He exercises at a club fitness 4 days/week.  He bikes for 30 minutes and then swims laps for 30 minutes.  Over the last 6 months he is noted that he has been increasingly short of breath.  He never has chest pain with exertion.  He has not experienced any lower extremity edema, orthopnea, or PND.  He notes that his diet has been good.  He limits fried and fatty foods since he had his gallbladder removed in 2011.  Past Medical History:  Diagnosis Date  . Arthritis   . Coronary artery calcification seen on CAT scan 01/02/2018  . Exertional dyspnea 01/02/2018  . Pure hypercholesterolemia 01/02/2018    Past Surgical History:  Procedure Laterality Date  . CHOLECYSTECTOMY  2008  . salvary gland  2003   large gland inside of cheek     Current Outpatient Medications  Medication Sig Dispense Refill  . LORazepam (ATIVAN) 0.5 MG tablet     . atorvastatin (LIPITOR) 20 MG tablet Take 1 tablet (20 mg total) by mouth daily. 90  tablet 1   No current facility-administered medications for this visit.     Allergies:   Adhesive [tape]    Social History:  The patient  reports that he has never smoked. He has never used smokeless tobacco. He reports current alcohol use. He reports that he does not use drugs.   Family History:  The patient's family history is not on file. He was adopted.  Patient is adopted.    ROS:  Please see the history of present illness.   Otherwise, review of systems are positive for none.   All other systems are reviewed and negative.    PHYSICAL EXAM: VS:  BP 136/80   Pulse 65   Ht 5\' 10"  (1.778 m)   Wt 237 lb (107.5 kg)   SpO2 96%   BMI 34.01 kg/m  , BMI Body mass index is 34.01 kg/m. GENERAL:  Well appearing HEENT:  Pupils equal round and reactive, fundi not visualized, oral mucosa unremarkable NECK:  No jugular venous distention, waveform within normal limits, carotid upstroke brisk and symmetric, no bruits, no thyromegaly LYMPHATICS:  No cervical adenopathy LUNGS:  Clear to auscultation bilaterally HEART:  RRR.  PMI not displaced or sustained,S1 and S2 within normal limits, no S3, no S4, no clicks, no rubs,  murmurs ABD:  Flat, positive bowel  sounds normal in frequency in pitch, no bruits, no rebound, no guarding, no midline pulsatile mass, no hepatomegaly, no splenomegaly EXT:  2 plus pulses throughout, no edema, no cyanosis no clubbing SKIN:  No rashes no nodules NEURO:  Cranial nerves II through XII grossly intact, motor grossly intact throughout PSYCH:  Cognitively intact, oriented to person place and time    EKG:  EKG is ordered today. The ekg ordered today demonstrates sinus bradycardia.  Rate 59 bpm.   Recent Labs: 12/27/2017: ALT 38; BUN 13; Creatinine 0.98; Hemoglobin 15.5; Platelets 127; Potassium 4.3; Sodium 141   06/05/2017: Total cholesterol 185, triglycerides 180, HDL 45, LDL 104 Sodium 138, potassium 4.5, BUN 13, creatinine AST 20, ALT 29   Lipid  Panel No results found for: CHOL, TRIG, HDL, CHOLHDL, VLDL, LDLCALC, LDLDIRECT    Wt Readings from Last 3 Encounters:  01/02/18 237 lb (107.5 kg)  12/27/17 237 lb 6.4 oz (107.7 kg)  06/28/17 242 lb 12.8 oz (110.1 kg)      ASSESSMENT AND PLAN:  # Athereosclerosis of the aorta and LAD: # Shortness of breath: # Hyperlipidemia: Garrett Keller has increasing shortness of breath with exertion but no chest pain.  Chest CT showed coronary calcification.  We will get an ETT to assess for ischemia.   He also has a PET scan pending to look at his pulmonary nodules.  Increase atorvastatin to 20mg .  Check lipids/CMP in 6-8 weeks.  LDL goal is <70.  Start aspirin 81mg  daily.   # Elevated BP:  Will address at follow up.    Current medicines are reviewed at length with the patient today.  The patient does not have concerns regarding medicines.  The following changes have been made:  Increase atorvastatin.  Start aspirin.   Labs/ tests ordered today include:   Orders Placed This Encounter  Procedures  . Lipid panel  . Comprehensive metabolic panel  . Exercise Tolerance Test     Disposition:   FU with Garrett Templeman C. Oval Linsey, MD, [facility] in 2 months.      Signed, Garrett Malachi C. Oval Linsey, MD, [facility]  01/02/2018 1:14 PM    [facility]

## 2018-01-02 NOTE — Addendum Note (Signed)
Addended by: Alvina Filbert B on: 01/02/2018 01:45 PM   Modules accepted: Orders

## 2018-01-02 NOTE — Patient Instructions (Signed)
Medication Instructions:  START ATORVASTATIN 20 MG DAILY   If you need a refill on your cardiac medications before your next appointment, please call your pharmacy.   Lab work: FASTING LP/CMET IN 2 MONTHS  If you have labs (blood work) drawn today and your tests are completely normal, you will receive your results only by: Marland Kitchen MyChart Message (if you have MyChart) OR . A paper copy in the mail If you have any lab test that is abnormal or we need to change your treatment, we will call you to review the results.  Testing/Procedures: Your physician has requested that you have an exercise tolerance test. For further information please visit [email or web address]. Please also follow instruction sheet, as given.  Follow-Up: At [facility], you and your health needs are our priority.  As part of our continuing mission to provide you with exceptional heart care, we have created designated Provider Care Teams.  These Care Teams include your primary Cardiologist (physician) and Advanced Practice Providers (APPs -  Physician Assistants and Nurse Practitioners) who all work together to provide you with the care you need, when you need it. You will need a follow up appointment in 2 months [place] may see  or one of the following Advanced Practice Providers on your designated Care Team:   Kerin Ransom, PA-C Roby Lofts, [place] . Sande Rives, PA-C  Any Other Special Instructions Will Be Listed Below (If Applicable).  Exercise Stress Electrocardiogram An exercise stress electrocardiogram is a test to check how blood flows to your heart. It is done to find areas of poor blood flow. You will need to walk on a treadmill for this test. The electrocardiogram will record your heartbeat when you are at rest and when you are exercising. What happens before the procedure?  Do not have drinks with caffeine or foods with caffeine for 24 hours before the test, or as told by your doctor. This  includes coffee, tea (even decaf tea), sodas, chocolate, and cocoa.  Follow your doctor's instructions about eating and drinking before the test.  Ask your doctor what medicines you should or should not take before the test. Take your medicines with water unless told by your doctor not to.  If you use an inhaler, bring it with you to the test.  Bring a snack to eat after the test.  Do not  smoke for 4 hours before the test.  Do not put lotions, powders, creams, or oils on your chest before the test.  Wear comfortable shoes and clothing. What happens during the procedure?  You will have patches put on your chest. Small areas of your chest may need to be shaved. Wires will be connected to the patches.  Your heart rate will be watched while you are resting and while you are exercising.  You will walk on the treadmill. The treadmill will slowly get faster to raise your heart rate.  The test will take about 1-2 hours. What happens after the procedure?  Your heart rate and blood pressure will be watched after the test.  You may return to your normal diet, activities, and medicines or as told by your doctor. This information is not intended to replace advice given to you by your health care provider. Make sure you discuss any questions you have with your health care provider. Document Released: 06/27/2007 Document Revised: 09/07/2015 Document Reviewed: 09/15/2012 Elsevier Interactive Patient Education  [employer].

## 2018-01-08 ENCOUNTER — Telehealth (HOSPITAL_COMMUNITY): Payer: Self-pay

## 2018-01-08 NOTE — Telephone Encounter (Signed)
Encounter complete. 

## 2018-01-10 ENCOUNTER — Ambulatory Visit (HOSPITAL_COMMUNITY)
Admission: RE | Admit: 2018-01-10 | Discharge: 2018-01-10 | Disposition: A | Discharge status: Home / Self Care | Payer: BLUE CROSS/BLUE SHIELD | Source: Ambulatory Visit | Attending: Internal Medicine | Admitting: Internal Medicine

## 2018-01-10 DIAGNOSIS — R0609 Other forms of dyspnea: Secondary | ICD-10-CM | POA: Diagnosis not present

## 2018-01-11 LAB — EXERCISE TOLERANCE TEST
CSEPED: 10 min
CSEPEW: 13 METS
CSEPHR: 97 %
CSEPPHR: 157 {beats}/min
Exercise duration (sec): 47 s
MPHR: 161 {beats}/min
RPE: 16
Rest HR: 65 {beats}/min

## 2018-02-24 DIAGNOSIS — N401 Enlarged prostate with lower urinary tract symptoms: Secondary | ICD-10-CM | POA: Diagnosis not present

## 2018-02-24 DIAGNOSIS — R972 Elevated prostate specific antigen [PSA]: Secondary | ICD-10-CM | POA: Diagnosis not present

## 2018-02-24 DIAGNOSIS — N138 Other obstructive and reflux uropathy: Secondary | ICD-10-CM | POA: Diagnosis not present

## 2018-03-11 ENCOUNTER — Telehealth: Payer: Self-pay | Admitting: *Deleted

## 2018-03-11 NOTE — Telephone Encounter (Signed)
Patient spouse left voice mail asking if PET scheduled for Friday 2/21 was approved. Stated they had not been called by [facility] managed care. Asked staff to call husband @ [phone].  Per Referral doc in [place], Darlena C with managed care spoke with [employer] rep 2/17 and no Auth required. Contacted Darlena who verified that is the information received from [facility] and that she left voice mail for husband yesterday at number stated above with that information. Attempted to contact husband at number stated above and left general message on un-named voice mail that test approved without authorization required. Encouraged to contact office @ [phone] for further questions or concerns.

## 2018-03-12 ENCOUNTER — Telehealth: Payer: Self-pay | Admitting: *Deleted

## 2018-03-12 NOTE — Telephone Encounter (Signed)
Patient left voice mail on nurse desk phone stating he had called Darlena C four times for information regarding authorization for PET scan scheduled for 7:30am 2/21. Stated he did not want to arrive on Friday and discover it was not authorized. He had not received a response and if no response, was planning to change doctor. Left [phone] as contact number. Attempted to contact patient at number stated above two times.  Each time left general message on un-named voice mail that test approved without authorization required. Stated had left voice mail yesterday for patient. Encouraged to contact office @ [phone] for further questions or concerns.

## 2018-03-14 ENCOUNTER — Ambulatory Visit (HOSPITAL_COMMUNITY)
Admission: RE | Admit: 2018-03-14 | Discharge: 2018-03-14 | Disposition: A | Discharge status: Home / Self Care | Payer: BLUE CROSS/BLUE SHIELD | Source: Ambulatory Visit | Attending: Hematology | Admitting: Hematology

## 2018-03-14 DIAGNOSIS — R911 Solitary pulmonary nodule: Secondary | ICD-10-CM | POA: Diagnosis not present

## 2018-03-14 DIAGNOSIS — R918 Other nonspecific abnormal finding of lung field: Secondary | ICD-10-CM | POA: Diagnosis not present

## 2018-03-14 LAB — GLUCOSE, CAPILLARY: GLUCOSE-CAPILLARY: 108 mg/dL — AB (ref 70–99)

## 2018-03-14 MED ORDER — FLUDEOXYGLUCOSE F - 18 (FDG) INJECTION
11.8400 | Freq: Once | INTRAVENOUS | Status: AC | PRN
  Administered 2018-03-14: 11.84 via INTRAVENOUS

## 2018-03-20 NOTE — Progress Notes (Signed)
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  HISTORY OF PRESENTING ILLNESS:  Garrett Keller is a wonderful 58 y.o. 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  Patient reports that he had a right parotid mass excised in 2003 but that there was no definitive pathologic diagnosis. He was followed by Dr. Eston Esters from oncology with interval scans but had no new concerns until 2007.  Patient recently presented with a fullness in the left side of the face with a small palpable mass possibly present for several months but was noticed about 2 months ago. He was evaluated by Dr. Constance Holster from ENT on 12/23/2015    He subsequently had a resection of his left parotid mass on 02/01/2016 which showed a MALT lymphoma of the left parotid gland. Patient subsequently had a PET/CT scan on 02/24/2016 which showed Diffuse hypermetabolism throughout [facility] ring which demonstrates diffuse soft tissue thickening (left greater than right). Small adjacent bilateral level IIa lymph nodes demonstrate hypermetabolism, as above. In addition, there is a focal area of soft tissue prominence in the inferior aspect of the left parotid gland immediately inferior to the left external auditory canal extending to the skin surface in the inferior aspect of the left year which demonstrates hypermetabolism. This may represent an additional focus of disease. No evidence of FDG avid lymphadenopathy in the chest or abdomen or focal skeletal lesions .  Patient notes that he has healed well from surgery . He has no specific focal symptoms . No fevers no chills no night sweats no unexpected weight loss . He overall  feels quite well .  INTERVAL HISTORY  Garrett Keller is here for f/u for his MALT lymphoma. The patient's last visit with [place] was on 12/27/17. He is accompanied today by his wife. The pt reports that he is doing well overall.   The pt notes that he is producing extra phlegm in the morning, which is clearing more easily than it used to in comparison to a year ago. 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 The pt adds that his mother "smoked like a chimney," when he was growing up.   The pt notes that he is not limited by SOB in his daily activities, and performs aerobic exercise three times a week at a local gym.   The pt notes that he is following with urology and expresses concern for a recent UTI, and completed a course of antibiotics 2-3 weeks ago. He is tracking his PSA level with urology.   The pt notes that he is enjoying good energy levels and endorses feeling that he is at his baseline. Denies changes in bowel habits, fevers, chills, or night sweats.   Of note since the patient's last visit, pt has had a PET/CT completed on 03/14/18 with results revealing Volume loss below the right first sternocostal articulation partially obscures the ground-glass density pulmonary nodule of concern in the right upper lobe. A measurement of the nodule independent from the articulation is not feasible, but there is not thought to be a significant high activity in  the nodule at this time. This does not necessarily exclude low-grade adenocarcinoma and surveillance is likely indicated. 2. The focus of architectural distortion and nodularity in the lingula is not hypermetabolic. None of the other smaller nodules are appreciably hypermetabolic although these remain below sensitive PET-CT size thresholds. 3. Centrally accentuated activity at the prostate apex, as before, probably from radiopharmaceutical in the prostatic urethra, although  technically nonspecific. Correlate with PSA levels in determining need for further prostate workup. There is moderate prostatomegaly. 4.  Aortic Atherosclerosis.  Coronary atherosclerosis.  Lab results today (03/21/18) of CBC w/diff and CMP is as follows: all values are WNL except for WBC at 3.7k, PLT at 132k. 03/21/18 LDH is at 132  On review of systems, pt reports improving phlegm production, staying active, good energy levels, and denies CP, fevers, chills, night sweats, abdominal pains, changes in bowel habits, and any other symptoms.   MEDICAL HISTORY:  Past Medical History:  Diagnosis Date  . Arthritis   . Coronary artery calcification seen on CAT scan 01/02/2018  . Exertional dyspnea 01/02/2018  . Pure hypercholesterolemia 01/02/2018  Rt parotid mass excised in 2003 - patient notes there was no definitive diagnosis. Patient was followed by Dr. Truddie Coco until 2007. 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  SURGICAL HISTORY: Past Surgical History:  Procedure Laterality Date  . CHOLECYSTECTOMY  2008  . salvary gland  2003   large gland inside of cheek    SOCIAL HISTORY: Social History   Socioeconomic History  . Marital status: Married    Spouse name: Not on file  . Number of children: Not on file  . Years of education: Not on file  . Highest education level: Not on file  Occupational History  . Not on file  Social Needs  . Financial resource strain: Not on file  . Food insecurity:    Worry: Not on file    Inability: Not on file  . Transportation needs:    Medical: Not on file    Non-medical: Not on file  Tobacco Use  . Smoking status: Never Smoker  . Smokeless tobacco: Never Used  Substance and Sexual Activity  . Alcohol use: Yes    Comment: weekeneds  . Drug use: No  . Sexual activity: Not on file    Lifestyle  . Physical activity:    Days per week: Not on file    Minutes per session: Not on file  . Stress: Not on file  Relationships  . Social connections:    Talks on phone: Not on file    Gets together: Not on file    Attends religious service: Not on file    Active member of club or organization: Not on file    Attends meetings of clubs or organizations: Not on file    Relationship status: Not on file  . Intimate partner violence:    Fear of current or ex partner: Not on file    Emotionally abused: Not on file    Physically abused: Not on file    Forced sexual activity: Not on file  Other Topics Concern  . Not on file  Social History Narrative  . Not on file  Has been a smoker but has had a history of exposure to secondhand smoke . 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 ALLERGIES:  is allergic to adhesive [tape].  MEDICATIONS:  Current Outpatient Medications  Medication Sig Dispense Refill  . atorvastatin (LIPITOR) 20 MG tablet Take 1 tablet (20 mg total) by mouth daily. 90 tablet 1  . LORazepam (ATIVAN) 0.5 MG tablet      No current facility-administered medications for this visit.     REVIEW OF SYSTEMS:    A 10+ POINT REVIEW OF SYSTEMS WAS OBTAINED including neurology, dermatology, psychiatry, cardiac, respiratory, lymph, extremities, GI, GU, Musculoskeletal, constitutional, breasts, reproductive, HEENT.  All pertinent positives are noted in the HPI.  All others are negative.   PHYSICAL EXAMINATION: ECOG PERFORMANCE STATUS: 0 - Asymptomatic  . Vitals:   03/21/18 0834  BP: 121/89  Pulse: 63  Resp: 18  Temp: 97.8 F (36.6 C)  SpO2: 98%   Filed Weights   03/21/18 0834  Weight: 239 lb 12.8 oz (108.8 kg)   .Body mass index is 34.41 kg/m.  GENERAL:alert, in no acute distress and comfortable SKIN: no  acute rashes, no significant lesions EYES: conjunctiva are pink and non-injected, sclera anicteric OROPHARYNX: MMM, no exudates, no oropharyngeal erythema or ulceration NECK: supple, no JVD LYMPH:  no palpable lymphadenopathy in the cervical, axillary or inguinal regions LUNGS: clear to auscultation b/l with normal respiratory effort HEART: regular rate & rhythm ABDOMEN:  normoactive bowel sounds , non tender, not distended. No palpable hepatosplenomegaly.  Extremity: no pedal edema PSYCH: alert & oriented x 3 with fluent speech NEURO: no focal motor/sensory deficits   LABORATORY DATA:  I have reviewed the data as listed  . CBC Latest Ref Rng & Units 03/21/2018 12/27/2017 06/28/2017  WBC 4.0 - 10.5 K/uL 3.7(L) 3.7(L) 3.5(L)  Hemoglobin 13.0 - 17.0 g/dL 15.5 15.5 14.9  Hematocrit 39.0 - 52.0 % 46.3 45.8 43.3  Platelets 150 - 400 K/uL 132(L) 127(L) 142    . CMP Latest Ref Rng & Units 03/21/2018 12/27/2017 06/28/2017  Glucose 70 - 99 mg/dL 99 102(H) 108  BUN 6 - 20 mg/dL 16 13 21   Creatinine 0.61 - 1.24 mg/dL 1.00 0.98 1.06  Sodium 135 - 145 mmol/L 140 141 141  Potassium 3.5 - 5.1 mmol/L 4.2 4.3 4.1  Chloride 98 - 111 mmol/L 106 107 107  CO2 22 - 32 mmol/L 26 24 23   Calcium 8.9 - 10.3 mg/dL 9.3 9.4 9.6  Total Protein 6.5 - 8.1 g/dL 8.1 8.0 8.1  Total Bilirubin 0.3 - 1.2 mg/dL 1.0 1.0 0.8  Alkaline Phos 38 - 126 U/L 54 55 54  AST 15 - 41 U/L 24 23 20   ALT 0 - 44 U/L 38 38 29   . Lab Results  Component Value Date   LDH 132 03/21/2018    Component     Latest Ref Rng & Units 03/09/2016  LDH     125 - 245 U/L 158  Hep C Virus Ab     0.0 - 0.9 s/co ratio <0.1  Hepatitis B Surface Ag     Negative Negative  Hep B Core Ab, Tot     Negative Negative        RADIOGRAPHIC STUDIES: I have personally reviewed the radiological images as listed and agreed with the findings in the report. Nm Pet Image Restag (ps) Skull Base To Thigh  Result Date: 03/14/2018 CLINICAL DATA:   Subsequent treatment strategy for pulmonary nodules. EXAM: NUCLEAR MEDICINE PET SKULL BASE TO  THIGH TECHNIQUE: 11.8 mCi F-18 FDG was injected intravenously. Full-ring PET imaging was performed from the skull base to thigh after the radiotracer. CT data was obtained and used for attenuation correction and anatomic localization. Fasting blood glucose: 108 mg/dl COMPARISON:  Multiple exams, including 02/24/2016 and 12/13/2017 FINDINGS: Mediastinal blood pool activity: SUV max 3.0 NECK: Symmetric tonsillar activity, likely physiologic. Incidental CT findings: Bilateral common carotid artery atherosclerotic calcification. CHEST: There is mild volume loss below the right first sternocostal articulation partially obscuring the pulmonary nodule in this vicinity. The articulation itself introduces some accentuated metabolic activity which in turn obscures the nodule. A measurement in this vicinity would likely be a measurement of the articular activity rather than the nodule. The irregular lingular lesion has a maximum SUV of only 0.4 and the remaining small nodules are not hypermetabolic but are below sensitive PET-CT size thresholds. No hypermetabolic adenopathy. Distal esophageal activity, maximum SUV 3.4, likely physiologic. Incidental CT findings: Atherosclerotic calcification of the thoracic aorta and left anterior descending coronary artery ABDOMEN/PELVIS: Accentuated activity centrally at the prostate apex, maximum SUV 7.1 and formerly 10.6, with otherwise somewhat heterogeneous prostate activity. There is a suggestion of radiopharmaceutical in the penile urethral region. Incidental CT findings: Cholecystectomy.  Moderate prostatomegaly. SKELETON: No significant abnormal hypermetabolic activity in this region. Incidental CT findings: Old healed left lateral rib fractures. IMPRESSION: 1. Volume loss below the right first sternocostal articulation partially obscures the ground-glass density pulmonary nodule of concern  in the right upper lobe. A measurement of the nodule independent from the articulation is not feasible, but there is not thought to be a significant high activity in the nodule at this time. This does not necessarily exclude low-grade adenocarcinoma and surveillance is likely indicated. 2. The focus of architectural distortion and nodularity in the lingula is not hypermetabolic. None of the other smaller nodules are appreciably hypermetabolic although these remain below sensitive PET-CT size thresholds. 3. Centrally accentuated activity at the prostate apex, as before, probably from radiopharmaceutical in the prostatic urethra, although technically nonspecific. Correlate with PSA levels in determining need for further prostate workup. There is moderate prostatomegaly. 4.  Aortic Atherosclerosis (ICD10-I70.0).  Coronary atherosclerosis. Electronically Signed   By: Van Clines M.D.   On: 03/14/2018 09:28    ASSESSMENT & PLAN:   58 y.o. generally healthy Caucasian male with previous history of right parotid gland lesion resected in 2003 and monitored with scans by Dr. Eston Esters now presents with   1) Left Parotid gland slowly growing mass this was resected by Dr. Constance Holster and pathology is consistent with an extranodal marginal Zone lymphoma of the parotid MALT tissue. PET/CT suggest possible residual lesion in the parotid gland. There are a few small upper cervical LN's and uptake in the [facility] ring   Cervical lymph node biopsy is consistent with MALT lymphoma involvement. After discussing the various treatment options  including observation versus Rituxan monotherapy vs ISRT vs chemo-immunotherapy and the rationale for different treatments. Patient choose to proceed with active surveillance and observation, which is what we would recommend since he does not have much residual MALT lymphoma and had no symptoms attributable to this. Hepatitis profile neg.  12/13/17 CT C/A/P revealed A right upper  lobe ground-glass density pulmonary nodule has significantly enlarged over the past year, currently measuring 1.2 by 0.9 cm and formerly 0.5 by 0.7 cm. This slow but progressive growth raises concern for potential low-grade adenocarcinoma. Consider discussion in a multidisciplinary cancer conference setting. Possible further workup might include close  surveillance CT imaging, tissue diagnosis, or nuclear medicine PET-CT. 2. Minimal enlargement of a small focus of lingular architectural distortion and ground-glass nodularity, currently 1.2 by 1.0 cm. 3. The other small solid and sub solid pulmonary nodules appear Stable. 4. No current adenopathy. 5.  Aortic Atherosclerosis.  Coronary atherosclerosis. 6. Moderate prostatomegaly.  12/13/17 CT Neck revealed No recurrent parotid mass or cervical adenopathy. No thickening of Waldeyer's ring. 2. Generalized salivary atrophy with progressive dilatation of the left parotid duct.  2) Pulmonary nodule, as first noted on the above 12/13/17 CT C/A/P  PLAN:  -Discussed pt labwork today, 03/21/18; blood counts and chemistries are stable. LDH normal at 132 -Discussed the 03/14/18 PET/CT which revealed Volume loss below the right first sternocostal articulation partially obscures the ground-glass density pulmonary nodule of concern in the right upper lobe. A measurement of the nodule independent from the articulation is not feasible, but there is not thought to be a significant high activity in the nodule at this time. This does not necessarily exclude low-grade adenocarcinoma and surveillance is likely indicated. 2. The focus of architectural distortion and nodularity in the lingula is not hypermetabolic. None of the other smaller nodules are appreciably hypermetabolic although these remain below sensitive PET-CT size thresholds. 3. Centrally accentuated activity at the prostate apex, as before, probably from radiopharmaceutical in the prostatic urethra, although technically  nonspecific. Correlate with PSA levels in determining need for further prostate workup. There is moderate prostatomegaly. 4.  Aortic Atherosclerosis.  Coronary atherosclerosis. -Discussed that the architectural distortion seen in the PET/CT could be inflammation related to his previous occupational dust exposure. The pt has never smoked but notes his mother was a smoker. -Offered to refer the pt to pulmonology in the context of his occupational dust exposure and radiographic imaging, which the pt prefers. -Will refer the pt to Pulmonology for further evaluation of pulmonary findings -Already in the care of urologist with elevated PSAs and recent UTI. Continue follow up with urology as the pt has been doing. -Recommend repeating a CT Chest in at least 6 months, unless pulmonology orders one before hand -The pt shows no clinical, radiographic, or lab progression/return of his MALT lymphoma at this time. -No indication for further treatment of his MALT lymphoma at this time.  -Will see the pt back in 6 months   Referral to pulmonary  CT chest wo contrast in 6 months RTC with Dr Irene Limbo with labs in 66months   All of the patients questions were answered with apparent satisfaction. The patient knows to call the clinic with any problems, questions or concerns.  The total time spent in the appt was 25 minutes and more than 50% was on counseling and direct patient cares.    Sullivan Lone MD [place] AAHIVMS [facility] Hematology/Oncology Physician [facility]  (Office):       [phone] (Work cell):  [phone] (Fax):           [phone]  I, Baldwin Jamaica, am acting as a scribe for Dr. Sullivan Lone.   .I have reviewed the above documentation for accuracy and completeness, and I agree with the above. Brunetta Genera MD

## 2018-03-21 ENCOUNTER — Inpatient Hospital Stay: Payer: BLUE CROSS/BLUE SHIELD

## 2018-03-21 ENCOUNTER — Inpatient Hospital Stay: Payer: BLUE CROSS/BLUE SHIELD | Attending: Hematology | Admitting: Hematology

## 2018-03-21 ENCOUNTER — Telehealth: Payer: Self-pay | Admitting: Hematology

## 2018-03-21 VITALS — BP 121/89 | HR 63 | Temp 97.8°F | Resp 18 | Ht 70.0 in | Wt 239.8 lb

## 2018-03-21 DIAGNOSIS — R918 Other nonspecific abnormal finding of lung field: Secondary | ICD-10-CM

## 2018-03-21 DIAGNOSIS — C884 Extranodal marginal zone B-cell lymphoma of mucosa-associated lymphoid tissue [MALT-lymphoma]: Secondary | ICD-10-CM | POA: Insufficient documentation

## 2018-03-21 DIAGNOSIS — R911 Solitary pulmonary nodule: Secondary | ICD-10-CM

## 2018-03-21 DIAGNOSIS — E785 Hyperlipidemia, unspecified: Secondary | ICD-10-CM | POA: Diagnosis not present

## 2018-03-21 LAB — CBC WITH DIFFERENTIAL/PLATELET
Abs Immature Granulocytes: 0.01 10*3/uL (ref 0.00–0.07)
Basophils Absolute: 0 10*3/uL (ref 0.0–0.1)
Basophils Relative: 1 %
EOS PCT: 3 %
Eosinophils Absolute: 0.1 10*3/uL (ref 0.0–0.5)
HCT: 46.3 % (ref 39.0–52.0)
Hemoglobin: 15.5 g/dL (ref 13.0–17.0)
Immature Granulocytes: 0 %
LYMPHS ABS: 1.5 10*3/uL (ref 0.7–4.0)
Lymphocytes Relative: 40 %
MCH: 29.6 pg (ref 26.0–34.0)
MCHC: 33.5 g/dL (ref 30.0–36.0)
MCV: 88.5 fL (ref 80.0–100.0)
MONOS PCT: 12 %
Monocytes Absolute: 0.5 10*3/uL (ref 0.1–1.0)
NRBC: 0 % (ref 0.0–0.2)
Neutro Abs: 1.7 10*3/uL (ref 1.7–7.7)
Neutrophils Relative %: 44 %
Platelets: 132 10*3/uL — ABNORMAL LOW (ref 150–400)
RBC: 5.23 MIL/uL (ref 4.22–5.81)
RDW: 12.3 % (ref 11.5–15.5)
WBC: 3.7 10*3/uL — ABNORMAL LOW (ref 4.0–10.5)

## 2018-03-21 LAB — CMP (CANCER CENTER ONLY)
ALK PHOS: 54 U/L (ref 38–126)
ALT: 38 U/L (ref 0–44)
ANION GAP: 8 (ref 5–15)
AST: 24 U/L (ref 15–41)
Albumin: 4.1 g/dL (ref 3.5–5.0)
BILIRUBIN TOTAL: 1 mg/dL (ref 0.3–1.2)
BUN: 16 mg/dL (ref 6–20)
CALCIUM: 9.3 mg/dL (ref 8.9–10.3)
CO2: 26 mmol/L (ref 22–32)
Chloride: 106 mmol/L (ref 98–111)
Creatinine: 1 mg/dL (ref 0.61–1.24)
Glucose, Bld: 99 mg/dL (ref 70–99)
Potassium: 4.2 mmol/L (ref 3.5–5.1)
SODIUM: 140 mmol/L (ref 135–145)
Total Protein: 8.1 g/dL (ref 6.5–8.1)

## 2018-03-21 LAB — LACTATE DEHYDROGENASE: LDH: 132 U/L (ref 98–192)

## 2018-03-21 NOTE — Telephone Encounter (Signed)
Scheduled appt per 2/28 los.  Printed calendar and avs.

## 2018-03-22 LAB — LIPID PANEL
CHOLESTEROL TOTAL: 154 mg/dL (ref 100–199)
Chol/HDL Ratio: 3.9 ratio (ref 0.0–5.0)
HDL: 39 mg/dL — AB (ref >39)
LDL CALC: 96 mg/dL (ref 0–99)
TRIGLYCERIDES: 97 mg/dL (ref 0–149)
VLDL CHOLESTEROL CAL: 19 mg/dL (ref 5–40)

## 2018-03-24 ENCOUNTER — Ambulatory Visit: Payer: BLUE CROSS/BLUE SHIELD | Admitting: Cardiovascular Disease

## 2018-04-10 ENCOUNTER — Telehealth: Payer: Self-pay | Admitting: *Deleted

## 2018-04-10 DIAGNOSIS — Z5181 Encounter for therapeutic drug level monitoring: Secondary | ICD-10-CM

## 2018-04-10 DIAGNOSIS — E785 Hyperlipidemia, unspecified: Secondary | ICD-10-CM

## 2018-04-10 MED ORDER — ATORVASTATIN CALCIUM 40 MG PO TABS: 40.0000 mg | ORAL_TABLET | Freq: Every day | ORAL | 1 refills | Status: DC

## 2018-04-10 NOTE — Telephone Encounter (Signed)
-----   Message from Skeet Latch, MD sent at 04/04/2018  3:42 PM EDT ----- Cholesterol levels are very good but the LDL should be <70.   Recommend increasing atorvastatin to 40mg .  Repeat lipids/CMP in 2-3 months.

## 2018-04-10 NOTE — Telephone Encounter (Signed)
Advised patient, verbalized understanding  

## 2018-06-25 DIAGNOSIS — Z0000 Encounter for general adult medical examination without abnormal findings: Secondary | ICD-10-CM | POA: Diagnosis not present

## 2018-06-25 DIAGNOSIS — Z125 Encounter for screening for malignant neoplasm of prostate: Secondary | ICD-10-CM | POA: Diagnosis not present

## 2018-06-25 DIAGNOSIS — I2510 Atherosclerotic heart disease of native coronary artery without angina pectoris: Secondary | ICD-10-CM | POA: Diagnosis not present

## 2018-06-27 DIAGNOSIS — Z0000 Encounter for general adult medical examination without abnormal findings: Secondary | ICD-10-CM | POA: Diagnosis not present

## 2018-07-12 ENCOUNTER — Other Ambulatory Visit: Payer: Self-pay | Admitting: Cardiovascular Disease

## 2018-08-25 DIAGNOSIS — R972 Elevated prostate specific antigen [PSA]: Secondary | ICD-10-CM | POA: Diagnosis not present

## 2018-08-25 DIAGNOSIS — N401 Enlarged prostate with lower urinary tract symptoms: Secondary | ICD-10-CM | POA: Diagnosis not present

## 2018-08-25 DIAGNOSIS — N138 Other obstructive and reflux uropathy: Secondary | ICD-10-CM | POA: Diagnosis not present

## 2018-09-18 ENCOUNTER — Inpatient Hospital Stay: Payer: BC Managed Care – PPO | Attending: Hematology

## 2018-09-18 ENCOUNTER — Encounter (HOSPITAL_COMMUNITY): Payer: Self-pay

## 2018-09-18 ENCOUNTER — Other Ambulatory Visit: Payer: Self-pay

## 2018-09-18 ENCOUNTER — Ambulatory Visit (HOSPITAL_COMMUNITY)
Admission: RE | Admit: 2018-09-18 | Discharge: 2018-09-18 | Disposition: A | Discharge status: Home / Self Care | Payer: BC Managed Care – PPO | Source: Ambulatory Visit | Attending: Hematology | Admitting: Hematology

## 2018-09-18 DIAGNOSIS — R911 Solitary pulmonary nodule: Secondary | ICD-10-CM | POA: Insufficient documentation

## 2018-09-18 DIAGNOSIS — R918 Other nonspecific abnormal finding of lung field: Secondary | ICD-10-CM

## 2018-09-18 DIAGNOSIS — C884 Extranodal marginal zone B-cell lymphoma of mucosa-associated lymphoid tissue [MALT-lymphoma]: Secondary | ICD-10-CM | POA: Diagnosis not present

## 2018-09-18 DIAGNOSIS — I700 Atherosclerosis of aorta: Secondary | ICD-10-CM | POA: Diagnosis not present

## 2018-09-18 LAB — CBC WITH DIFFERENTIAL/PLATELET
Abs Immature Granulocytes: 0.01 10*3/uL (ref 0.00–0.07)
Basophils Absolute: 0 10*3/uL (ref 0.0–0.1)
Basophils Relative: 1 %
Eosinophils Absolute: 0.1 10*3/uL (ref 0.0–0.5)
Eosinophils Relative: 3 %
HCT: 47.3 % (ref 39.0–52.0)
Hemoglobin: 15.7 g/dL (ref 13.0–17.0)
Immature Granulocytes: 0 %
Lymphocytes Relative: 37 %
Lymphs Abs: 1.6 10*3/uL (ref 0.7–4.0)
MCH: 29.2 pg (ref 26.0–34.0)
MCHC: 33.2 g/dL (ref 30.0–36.0)
MCV: 88.1 fL (ref 80.0–100.0)
Monocytes Absolute: 0.5 10*3/uL (ref 0.1–1.0)
Monocytes Relative: 11 %
Neutro Abs: 2.1 10*3/uL (ref 1.7–7.7)
Neutrophils Relative %: 48 %
Platelets: 133 10*3/uL — ABNORMAL LOW (ref 150–400)
RBC: 5.37 MIL/uL (ref 4.22–5.81)
RDW: 12.7 % (ref 11.5–15.5)
WBC: 4.4 10*3/uL (ref 4.0–10.5)
nRBC: 0 % (ref 0.0–0.2)

## 2018-09-18 LAB — CMP (CANCER CENTER ONLY)
ALT: 41 U/L (ref 0–44)
AST: 24 U/L (ref 15–41)
Albumin: 4 g/dL (ref 3.5–5.0)
Alkaline Phosphatase: 62 U/L (ref 38–126)
Anion gap: 8 (ref 5–15)
BUN: 14 mg/dL (ref 6–20)
CO2: 23 mmol/L (ref 22–32)
Calcium: 9 mg/dL (ref 8.9–10.3)
Chloride: 108 mmol/L (ref 98–111)
Creatinine: 0.97 mg/dL (ref 0.61–1.24)
GFR, Est AFR Am: >60 mL/min (ref >60)
GFR, Estimated: >60 mL/min (ref >60)
Glucose, Bld: 103 mg/dL — ABNORMAL HIGH (ref 70–99)
Potassium: 4.2 mmol/L (ref 3.5–5.1)
Sodium: 139 mmol/L (ref 135–145)
Total Bilirubin: 1 mg/dL (ref 0.3–1.2)
Total Protein: 7.9 g/dL (ref 6.5–8.1)

## 2018-09-18 LAB — LACTATE DEHYDROGENASE: LDH: 133 U/L (ref 98–192)

## 2018-09-19 ENCOUNTER — Inpatient Hospital Stay: Payer: BC Managed Care – PPO | Admitting: Hematology

## 2018-10-20 ENCOUNTER — Telehealth: Payer: Self-pay | Admitting: Cardiovascular Disease

## 2018-10-20 NOTE — Telephone Encounter (Signed)
Returned the call to the patient. He stated that he has had diarrhea for the past three weeks. He would like to know if this could be from the Atorvastatin. This was increased in March to 40 mg once daily. He has not had any new medications or changes in diet that would cause the diarrhea.  He would like to know if there is something else he can try in replacement of the Atorvastatin 40 mg. He has been advised to stay well hydrated due to the three weeks of diarrhea.

## 2018-10-20 NOTE — Telephone Encounter (Signed)
It can cause diarrhea.  Let's try switching to rosuvastatin 20mg  instead.

## 2018-10-20 NOTE — Telephone Encounter (Signed)
° ° °  Pt c/o medication issue:  1. Name of Medication: atorvastatin (LIPITOR) 40 MG tablet  2. How are you currently taking this medication (dosage and times per day)? As written  3. Are you having a reaction (difficulty breathing--STAT)? diarrhea   4. What is your medication issue? diarrhea

## 2018-10-20 NOTE — Telephone Encounter (Signed)
Left message to call back  

## 2018-10-24 NOTE — Telephone Encounter (Signed)
Left message to call back  

## 2018-10-27 ENCOUNTER — Other Ambulatory Visit: Payer: Self-pay

## 2018-10-27 DIAGNOSIS — L570 Actinic keratosis: Secondary | ICD-10-CM | POA: Diagnosis not present

## 2018-10-27 DIAGNOSIS — D2261 Melanocytic nevi of right upper limb, including shoulder: Secondary | ICD-10-CM | POA: Diagnosis not present

## 2018-10-27 DIAGNOSIS — D485 Neoplasm of uncertain behavior of skin: Secondary | ICD-10-CM | POA: Diagnosis not present

## 2018-10-27 DIAGNOSIS — D235 Other benign neoplasm of skin of trunk: Secondary | ICD-10-CM | POA: Diagnosis not present

## 2018-10-28 ENCOUNTER — Encounter: Payer: Self-pay | Admitting: *Deleted

## 2018-10-28 MED ORDER — ROSUVASTATIN CALCIUM 20 MG PO TABS: 20.0000 mg | ORAL_TABLET | Freq: Every day | ORAL | 3 refills | Status: DC

## 2018-10-28 NOTE — Telephone Encounter (Signed)
Advised patient, verbalized understanding. Mailed letter so he could activate mychart

## 2018-10-28 NOTE — Addendum Note (Signed)
Addended by: Alvina Filbert B on: 10/28/2018 02:50 PM   Modules accepted: Orders

## 2018-10-28 NOTE — Telephone Encounter (Signed)
Left message to call back if he continues to have issues

## 2018-12-04 ENCOUNTER — Other Ambulatory Visit: Payer: Self-pay

## 2018-12-04 DIAGNOSIS — D235 Other benign neoplasm of skin of trunk: Secondary | ICD-10-CM | POA: Diagnosis not present

## 2018-12-04 DIAGNOSIS — D2361 Other benign neoplasm of skin of right upper limb, including shoulder: Secondary | ICD-10-CM | POA: Diagnosis not present

## 2019-02-27 DIAGNOSIS — D229 Melanocytic nevi, unspecified: Secondary | ICD-10-CM | POA: Diagnosis not present

## 2019-02-27 DIAGNOSIS — L570 Actinic keratosis: Secondary | ICD-10-CM | POA: Diagnosis not present

## 2019-04-01 DIAGNOSIS — Z1159 Encounter for screening for other viral diseases: Secondary | ICD-10-CM | POA: Diagnosis not present

## 2019-04-06 DIAGNOSIS — Z86010 Personal history of colonic polyps: Secondary | ICD-10-CM | POA: Diagnosis not present

## 2019-04-06 DIAGNOSIS — K635 Polyp of colon: Secondary | ICD-10-CM | POA: Diagnosis not present

## 2019-04-06 DIAGNOSIS — K5730 Diverticulosis of large intestine without perforation or abscess without bleeding: Secondary | ICD-10-CM | POA: Diagnosis not present

## 2019-04-06 DIAGNOSIS — K6289 Other specified diseases of anus and rectum: Secondary | ICD-10-CM | POA: Diagnosis not present

## 2019-07-01 DIAGNOSIS — K219 Gastro-esophageal reflux disease without esophagitis: Secondary | ICD-10-CM | POA: Diagnosis not present

## 2019-07-01 DIAGNOSIS — I700 Atherosclerosis of aorta: Secondary | ICD-10-CM | POA: Diagnosis not present

## 2019-07-01 DIAGNOSIS — Z8572 Personal history of non-Hodgkin lymphomas: Secondary | ICD-10-CM | POA: Diagnosis not present

## 2019-07-01 DIAGNOSIS — G4700 Insomnia, unspecified: Secondary | ICD-10-CM | POA: Diagnosis not present

## 2019-07-01 DIAGNOSIS — R972 Elevated prostate specific antigen [PSA]: Secondary | ICD-10-CM | POA: Diagnosis not present

## 2019-07-01 DIAGNOSIS — Z0000 Encounter for general adult medical examination without abnormal findings: Secondary | ICD-10-CM | POA: Diagnosis not present

## 2019-07-09 ENCOUNTER — Telehealth: Payer: Self-pay

## 2019-07-09 NOTE — Telephone Encounter (Signed)
Pt. missed appt. on 09/19/18 and did not reschedule. Per Dr. Irene Limbo; TCT pt. to ask if he wants to continue care and if so to schedule him for lab and MD visit appts., unable to reach pt. LVM to call clinic.

## 2019-07-15 ENCOUNTER — Telehealth: Payer: Self-pay | Admitting: *Deleted

## 2019-07-15 NOTE — Telephone Encounter (Signed)
Patient has not contacted office since message  left 6/17. Contacted patient again per Dr. Grier Mitts request:Missed f/u appt on 09/19/18 and did not reschedule at that time. Contacted to ask if he wants to continue care and if so to schedule him for lab and MD visit appts. No answer - LVM to contact office

## 2019-10-06 ENCOUNTER — Telehealth: Payer: Self-pay | Admitting: Hematology

## 2019-10-06 ENCOUNTER — Telehealth: Payer: Self-pay | Admitting: *Deleted

## 2019-10-06 NOTE — Telephone Encounter (Signed)
Called pt per 9/13 sch msg- no answer. Left message for patient to call back to schedule.

## 2019-10-06 NOTE — Telephone Encounter (Signed)
10/06/19: Late entry for 10/02/19. Received voice mail: patient requesting oncology screening/CT from radiology for non-Hodgkin's Lymphoma and the possible lung cancer. Dr. Irene Limbo informed.  Per Dr. Irene Limbo, patient last seen in February 2020 and missed f/u appt in August 2020. Patient will need MD appt to determine follow up needed. Contacted patient to provide this information - no response - VM full. Schedule message sent to contact patient for MD appt

## 2019-10-15 ENCOUNTER — Inpatient Hospital Stay: Payer: BC Managed Care – PPO

## 2019-10-15 ENCOUNTER — Encounter: Payer: Self-pay | Admitting: Physician Assistant

## 2019-10-15 ENCOUNTER — Telehealth: Payer: Self-pay | Admitting: Hematology

## 2019-10-15 ENCOUNTER — Other Ambulatory Visit: Payer: Self-pay

## 2019-10-15 ENCOUNTER — Ambulatory Visit (INDEPENDENT_AMBULATORY_CARE_PROVIDER_SITE_OTHER): Payer: BC Managed Care – PPO | Admitting: Physician Assistant

## 2019-10-15 ENCOUNTER — Inpatient Hospital Stay: Payer: BC Managed Care – PPO | Attending: Hematology | Admitting: Hematology

## 2019-10-15 VITALS — BP 131/89 | HR 67 | Temp 97.3°F | Resp 18 | Ht 70.0 in | Wt 248.0 lb

## 2019-10-15 DIAGNOSIS — R918 Other nonspecific abnormal finding of lung field: Secondary | ICD-10-CM

## 2019-10-15 DIAGNOSIS — C884 Extranodal marginal zone B-cell lymphoma of mucosa-associated lymphoid tissue [MALT-lymphoma]: Secondary | ICD-10-CM | POA: Diagnosis not present

## 2019-10-15 DIAGNOSIS — D225 Melanocytic nevi of trunk: Secondary | ICD-10-CM

## 2019-10-15 DIAGNOSIS — L820 Inflamed seborrheic keratosis: Secondary | ICD-10-CM | POA: Diagnosis not present

## 2019-10-15 DIAGNOSIS — Z87898 Personal history of other specified conditions: Secondary | ICD-10-CM

## 2019-10-15 DIAGNOSIS — D485 Neoplasm of uncertain behavior of skin: Secondary | ICD-10-CM | POA: Diagnosis not present

## 2019-10-15 DIAGNOSIS — I2510 Atherosclerotic heart disease of native coronary artery without angina pectoris: Secondary | ICD-10-CM | POA: Insufficient documentation

## 2019-10-15 DIAGNOSIS — I700 Atherosclerosis of aorta: Secondary | ICD-10-CM | POA: Diagnosis not present

## 2019-10-15 DIAGNOSIS — E669 Obesity, unspecified: Secondary | ICD-10-CM | POA: Diagnosis not present

## 2019-10-15 DIAGNOSIS — Z79899 Other long term (current) drug therapy: Secondary | ICD-10-CM | POA: Insufficient documentation

## 2019-10-15 DIAGNOSIS — N400 Enlarged prostate without lower urinary tract symptoms: Secondary | ICD-10-CM | POA: Insufficient documentation

## 2019-10-15 DIAGNOSIS — F4310 Post-traumatic stress disorder, unspecified: Secondary | ICD-10-CM | POA: Insufficient documentation

## 2019-10-15 DIAGNOSIS — F329 Major depressive disorder, single episode, unspecified: Secondary | ICD-10-CM | POA: Insufficient documentation

## 2019-10-15 DIAGNOSIS — D229 Melanocytic nevi, unspecified: Secondary | ICD-10-CM

## 2019-10-15 DIAGNOSIS — K219 Gastro-esophageal reflux disease without esophagitis: Secondary | ICD-10-CM | POA: Insufficient documentation

## 2019-10-15 DIAGNOSIS — D2262 Melanocytic nevi of left upper limb, including shoulder: Secondary | ICD-10-CM | POA: Diagnosis not present

## 2019-10-15 DIAGNOSIS — Z86018 Personal history of other benign neoplasm: Secondary | ICD-10-CM | POA: Diagnosis not present

## 2019-10-15 DIAGNOSIS — Z1283 Encounter for screening for malignant neoplasm of skin: Secondary | ICD-10-CM | POA: Diagnosis not present

## 2019-10-15 DIAGNOSIS — R911 Solitary pulmonary nodule: Secondary | ICD-10-CM | POA: Insufficient documentation

## 2019-10-15 HISTORY — DX: Melanocytic nevi, unspecified: D22.9

## 2019-10-15 LAB — CMP (CANCER CENTER ONLY)
ALT: 49 U/L — ABNORMAL HIGH (ref 0–44)
AST: 29 U/L (ref 15–41)
Albumin: 3.9 g/dL (ref 3.5–5.0)
Alkaline Phosphatase: 55 U/L (ref 38–126)
Anion gap: 6 (ref 5–15)
BUN: 16 mg/dL (ref 8–23)
CO2: 27 mmol/L (ref 22–32)
Calcium: 9.8 mg/dL (ref 8.9–10.3)
Chloride: 106 mmol/L (ref 98–111)
Creatinine: 0.97 mg/dL (ref 0.61–1.24)
GFR, Est AFR Am: >60 mL/min (ref >60)
GFR, Estimated: >60 mL/min (ref >60)
Glucose, Bld: 101 mg/dL — ABNORMAL HIGH (ref 70–99)
Potassium: 4 mmol/L (ref 3.5–5.1)
Sodium: 139 mmol/L (ref 135–145)
Total Bilirubin: 0.6 mg/dL (ref 0.3–1.2)
Total Protein: 8.1 g/dL (ref 6.5–8.1)

## 2019-10-15 LAB — CBC WITH DIFFERENTIAL/PLATELET
Abs Immature Granulocytes: 0.01 10*3/uL (ref 0.00–0.07)
Basophils Absolute: 0 10*3/uL (ref 0.0–0.1)
Basophils Relative: 0 %
Eosinophils Absolute: 0.1 10*3/uL (ref 0.0–0.5)
Eosinophils Relative: 2 %
HCT: 45.8 % (ref 39.0–52.0)
Hemoglobin: 15.6 g/dL (ref 13.0–17.0)
Immature Granulocytes: 0 %
Lymphocytes Relative: 41 %
Lymphs Abs: 1.7 10*3/uL (ref 0.7–4.0)
MCH: 29.2 pg (ref 26.0–34.0)
MCHC: 34.1 g/dL (ref 30.0–36.0)
MCV: 85.8 fL (ref 80.0–100.0)
Monocytes Absolute: 0.5 10*3/uL (ref 0.1–1.0)
Monocytes Relative: 11 %
Neutro Abs: 1.9 10*3/uL (ref 1.7–7.7)
Neutrophils Relative %: 46 %
Platelets: 119 10*3/uL — ABNORMAL LOW (ref 150–400)
RBC: 5.34 MIL/uL (ref 4.22–5.81)
RDW: 12.8 % (ref 11.5–15.5)
WBC: 4.1 10*3/uL (ref 4.0–10.5)
nRBC: 0 % (ref 0.0–0.2)

## 2019-10-15 LAB — LACTATE DEHYDROGENASE: LDH: 146 U/L (ref 98–192)

## 2019-10-15 NOTE — Progress Notes (Signed)
° °  Follow up Visit  Subjective  Garrett Keller is a 59 y.o. male who presents for the following: Annual Exam (left temple- new spot, left forehead- new, left shoulder- changed). Left forehead and left temple is new and left shoulder is new and growing.   Objective  Well appearing patient in no apparent distress; mood and affect are within normal limits.  A full examination was performed including scalp, head, eyes, ears, nose, lips, neck, chest, axillae, abdomen, back, buttocks, bilateral upper extremities, bilateral lower extremities, hands, feet, fingers, toes, fingernails, and toenails. All findings within normal limits unless otherwise noted below. No suspicious moles noted on back.   Objective  Left Upper Arm: Small dark macule     Objective  Left Shoulder - Posterior: Inflamed papule     Objective  Head - Anterior (Face) (2): Erythematous stuck-on, waxy papule or plaque.   Objective  Mid Back: History of 14 atypical moles since 2015  Objective  Mid Back: Tan-brown symmetric macules and papules. Numerous scattered nevi trunk and extremities. Photos compared.  Assessment & Plan  Neoplasm of uncertain behavior of skin (2) Left Upper Arm  Skin / nail biopsy Type of biopsy: tangential   Informed consent: discussed and consent obtained   Timeout: patient name, date of birth, surgical site, and procedure verified   Procedure prep:  Patient was prepped and draped in usual sterile fashion (Non sterile) Prep type:  Chlorhexidine Anesthesia: the lesion was anesthetized in a standard fashion   Anesthetic:  1% lidocaine w/ epinephrine 1-100,000 local infiltration Instrument used: flexible razor blade   Outcome: patient tolerated procedure well   Post-procedure details: wound care instructions given    Specimen 1 - Surgical pathology Differential Diagnosis: bcc vs scc, atypia Check Margins: No  Left Shoulder - Posterior  Skin / nail biopsy Type of biopsy:  tangential   Informed consent: discussed and consent obtained   Timeout: patient name, date of birth, surgical site, and procedure verified   Procedure prep:  Patient was prepped and draped in usual sterile fashion (Non sterile) Prep type:  Chlorhexidine Anesthesia: the lesion was anesthetized in a standard fashion   Anesthetic:  1% lidocaine w/ epinephrine 1-100,000 local infiltration Instrument used: flexible razor blade   Outcome: patient tolerated procedure well   Post-procedure details: wound care instructions given    Specimen 2 - Surgical pathology Differential Diagnosis: bcc vs scc, atypia Check Margins: No  Inflamed seborrheic keratosis (2) Head - Anterior (Face)  Destruction of lesion - Head - Anterior (Face) Complexity: simple   Destruction method: cryotherapy   Informed consent: discussed and consent obtained   Timeout:  patient name, date of birth, surgical site, and procedure verified Lesion destroyed using liquid nitrogen: Yes   Outcome: patient tolerated procedure well with no complications    History of atypical nevus Mid Back  Skin exam for malignant neoplasm Left Abdomen (side) - Upper  Total body skin exam  Nevus Mid Back

## 2019-10-15 NOTE — Patient Instructions (Signed)

## 2019-10-15 NOTE — Progress Notes (Signed)
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  HISTORY OF PRESENTING ILLNESS:  Garrett Keller is a wonderful 58 y.o. 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  Patient reports that he had a right parotid mass excised in 2003 but that there was no definitive pathologic diagnosis. He was followed by Dr. Eston Esters from oncology with interval scans but had no new concerns until 2007.  Patient recently presented with a fullness in the left side of the face with a small palpable mass possibly present for several months but was noticed about 2 months ago. He was evaluated by Dr. Constance Holster from ENT on 12/23/2015    He subsequently had a resection of his left parotid mass on 02/01/2016 which showed a MALT lymphoma of the left parotid gland. Patient subsequently had a PET/CT scan on 02/24/2016 which showed Diffuse hypermetabolism throughout [facility] ring which demonstrates diffuse soft tissue thickening (left greater than right). Small adjacent bilateral level IIa lymph nodes demonstrate hypermetabolism, as above. In addition, there is a focal area of soft tissue prominence in the inferior aspect of the left parotid gland immediately inferior to the left external auditory canal extending to the skin surface in the inferior aspect of the left year which demonstrates hypermetabolism. This may represent an additional focus of disease. No evidence of FDG avid lymphadenopathy in the chest or abdomen or focal skeletal lesions .  Patient notes that he has healed well from surgery . He has no specific focal symptoms . No fevers no chills no night sweats no unexpected weight loss . He overall  feels quite well .  INTERVAL HISTORY: Garrett Keller is here for f/u for his MALT lymphoma. The patient's last visit with [place] was on 03/21/2018. The pt reports that he is doing well overall.  The pt reports that he has not seen a Pulmonologist since our last visit. He has felt well and denies any new concerns or symptoms. He is currently exercising 3-4 times per week.   Lab results (07/01/2019) of CBC w/diff and CMP is as follows: all values are WNL except for WBC at 3.8K, PLT at 124K.  On review of systems, pt denies fevers, chills, night sweats, unexpected weight loss, abdominal pain/fullness, leg swelling, SOB, new lump/bumps and any other symptoms.   MEDICAL HISTORY:  Past Medical History:  Diagnosis Date  . Arthritis   . Coronary artery calcification seen on CAT scan 01/02/2018  . Exertional dyspnea 01/02/2018  . Pure hypercholesterolemia 01/02/2018  Rt parotid mass excised in 2003 - patient notes there was no definitive diagnosis. Patient was followed by Dr. Truddie Coco until 2007. 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  SURGICAL HISTORY: Past Surgical History:  Procedure Laterality Date  . CHOLECYSTECTOMY  2008  . salvary gland  2003   large gland inside of cheek    SOCIAL HISTORY: Social History   Socioeconomic History  . Marital status: Married    Spouse name: Not on file  . Number of children: Not on file  . Years  of education: Not on file  . Highest education level: Not on file  Occupational History  . Not on file  Tobacco Use  . Smoking status: Never Smoker  . Smokeless tobacco: Never Used  Vaping Use  . Vaping Use: Never used  Substance and Sexual Activity  . Alcohol use: Yes    Comment: weekeneds  . Drug use: No  . Sexual activity: Not on file  Other Topics Concern  . Not on file  Social History  Narrative  . Not on file   Social Determinants of Health   Financial Resource Strain:   . Difficulty of Paying Living Expenses: Not on file  Food Insecurity:   . Worried About Charity fundraiser in the Last Year: Not on file  . Ran Out of Food in the Last Year: Not on file  Transportation Needs:   . Lack of Transportation (Medical): Not on file  . Lack of Transportation (Non-Medical): Not on file  Physical Activity:   . Days of Exercise per Week: Not on file  . Minutes of Exercise per Session: Not on file  Stress:   . Feeling of Stress : Not on file  Social Connections:   . Frequency of Communication with Friends and Family: Not on file  . Frequency of Social Gatherings with Friends and Family: Not on file  . Attends Religious Services: Not on file  . Active Member of Clubs or Organizations: Not on file  . Attends Archivist Meetings: Not on file  . Marital Status: Not on file  Intimate Partner Violence:   . Fear of Current or Ex-Partner: Not on file  . Emotionally Abused: Not on file  . Physically Abused: Not on file  . Sexually Abused: Not on file  Has been a smoker but has had a history of exposure to secondhand smoke . 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 ALLERGIES:  is allergic to adhesive [tape] and atorvastatin.  MEDICATIONS:  Current Outpatient Medications  Medication Sig Dispense Refill  . LORazepam (ATIVAN) 0.5 MG tablet  (Patient not taking: Reported on 10/15/2019)    . rosuvastatin (CRESTOR) 20 MG tablet Take 1 tablet (20 mg total) by mouth daily. 90 tablet 3   No current facility-administered medications for this visit.    REVIEW OF SYSTEMS:   A 10+ POINT REVIEW OF SYSTEMS WAS OBTAINED including neurology, dermatology, psychiatry, cardiac, respiratory, lymph, extremities, GI, GU, Musculoskeletal,  constitutional, breasts, reproductive, HEENT.  All pertinent positives are noted in the HPI.  All others are negative.   PHYSICAL EXAMINATION: ECOG PERFORMANCE STATUS: 0 - Asymptomatic  . Vitals:   10/15/19 1404  BP: 131/89  Pulse: 67  Resp: 18  Temp: (!) 97.3 F (36.3 C)  SpO2: 97%   Filed Weights   10/15/19 1404  Weight: 248 lb (112.5 kg)   .Body mass index is 35.58 kg/m.   GENERAL:alert, in no acute distress and comfortable SKIN: no acute rashes, no significant lesions EYES: conjunctiva are pink and non-injected, sclera anicteric OROPHARYNX: MMM, no exudates, no oropharyngeal erythema or ulceration NECK: supple, no JVD LYMPH:  no palpable lymphadenopathy in the cervical, axillary or inguinal regions LUNGS: clear to auscultation b/l with normal respiratory effort HEART: regular rate & rhythm ABDOMEN:  normoactive bowel sounds , non tender, not distended. No palpable hepatosplenomegaly.  Extremity: no pedal  edema PSYCH: alert & oriented x 3 with fluent speech NEURO: no focal motor/sensory deficits  LABORATORY DATA:  I have reviewed the data as listed  . CBC Latest Ref Rng & Units 09/18/2018 03/21/2018 12/27/2017  WBC 4.0 - 10.5 K/uL 4.4 3.7(L) 3.7(L)  Hemoglobin 13.0 - 17.0 g/dL 15.7 15.5 15.5  Hematocrit 39 - 52 % 47.3 46.3 45.8  Platelets 150 - 400 K/uL 133(L) 132(L) 127(L)    . CMP Latest Ref Rng & Units 09/18/2018 03/21/2018 12/27/2017  Glucose 70 - 99 mg/dL 103(H) 99 102(H)  BUN 6 - 20 mg/dL 14 16 13   Creatinine 0.61 - 1.24 mg/dL 0.97 1.00 0.98  Sodium 135 - 145 mmol/L 139 140 141  Potassium 3.5 - 5.1 mmol/L 4.2 4.2 4.3  Chloride 98 - 111 mmol/L 108 106 107  CO2 22 - 32 mmol/L 23 26 24   Calcium 8.9 - 10.3 mg/dL 9.0 9.3 9.4  Total Protein 6.5 - 8.1 g/dL 7.9 8.1 8.0  Total Bilirubin 0.3 - 1.2 mg/dL 1.0 1.0 1.0  Alkaline Phos 38 - 126 U/L 62 54 55  AST 15 - 41 U/L 24 24 23   ALT 0 - 44 U/L 41 38 38   . Lab Results  Component Value Date   LDH 133 09/18/2018     Component     Latest Ref Rng & Units 03/09/2016  LDH     125 - 245 U/L 158  Hep C Virus Ab     0.0 - 0.9 s/co ratio <0.1  Hepatitis B Surface Ag     Negative Negative  Hep B Core Ab, Tot     Negative Negative        RADIOGRAPHIC STUDIES: I have personally reviewed the radiological images as listed and agreed with the findings in the report. No results found.  ASSESSMENT & PLAN:   59 y.o. generally healthy Caucasian male with previous history of right parotid gland lesion resected in 2003 and monitored with scans by Dr. Eston Esters now presents with   1) Left Parotid gland slowly growing mass this was resected by Dr. Constance Holster and pathology is consistent with an extranodal marginal Zone lymphoma of the parotid MALT tissue. PET/CT suggest possible residual lesion in the parotid gland. There are a few small upper cervical LN's and uptake in the [facility] ring   Cervical lymph node biopsy is consistent with MALT lymphoma involvement. After discussing the various treatment options  including observation versus Rituxan monotherapy vs ISRT vs chemo-immunotherapy and the rationale for different treatments. Patient choose to proceed with active surveillance and observation, which is what we would recommend since he does not have much residual MALT lymphoma and had no symptoms attributable to this. Hepatitis profile neg.  12/13/17 CT C/A/P revealed A right upper lobe ground-glass density pulmonary nodule has significantly enlarged over the past year, currently measuring 1.2 by 0.9 cm and formerly 0.5 by 0.7 cm. This slow but progressive growth raises concern for potential low-grade adenocarcinoma. Consider discussion in a multidisciplinary cancer conference setting. Possible further workup might include close surveillance CT imaging, tissue diagnosis, or nuclear medicine PET-CT. 2. Minimal enlargement of a small focus of lingular architectural distortion and ground-glass nodularity, currently  1.2 by 1.0 cm. 3. The other small solid and sub solid pulmonary nodules appear Stable. 4. No current adenopathy. 5.  Aortic Atherosclerosis.  Coronary atherosclerosis. 6. Moderate prostatomegaly.  12/13/17 CT Neck revealed No recurrent parotid mass or cervical adenopathy. No thickening of Waldeyer's ring. 2. 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  03/14/18 PET/CT revealed Volume loss below the right first sternocostal articulation partially obscures the ground-glass density pulmonary nodule of concern in the right upper lobe. A measurement of the nodule independent from the articulation is not feasible, but there is not thought to be a significant high activity in the nodule at this time. This does not necessarily exclude low-grade adenocarcinoma and surveillance is likely indicated. 2. The focus of architectural distortion and nodularity in the lingula is not hypermetabolic. None of the other smaller nodules are appreciably hypermetabolic although these remain below sensitive PET-CT size thresholds. 3. Centrally accentuated activity at the prostate apex, as before, probably from radiopharmaceutical in the prostatic urethra, although technically nonspecific. Correlate with PSA levels in determining need for further prostate workup. There is moderate prostatomegaly. 4.  Aortic Atherosclerosis.  Coronary atherosclerosis.  2) Pulmonary nodule, as first noted on the above 12/13/17 CT C/A/P  PLAN:  -Discussed pt labwork, 07/01/2019; all values are WNL except for WBC at 3.8K, PLT at 124K. -No lab or clinical evidence of MALT lymphoma recurrence at this time. No indication to restart treatment.  -Discussed with pt why he did not show in clinic for over 1.5 years. Advised pt that he must be seen in clinic for continued monitoring.  -Will get CT Neck/Chest/Abd/Pel in 1 week  -Will see back in 2 weeks via phone  -Will get labs today    FOLLOW UP: Labs today CT  neck/chest/abd/pelvis in 1 week Phone visit Dr Irene Limbo in 2 weeks   The total time spent in the appt was 20 minutes and more than 50% was on counseling and direct patient cares.  All of the patient's questions were answered with apparent satisfaction. The patient knows to call the clinic with any problems, questions or concerns.    Sullivan Lone MD [place] AAHIVMS [facility] Hematology/Oncology Physician [facility]  (Office):       [phone] (Work cell):  [phone] (Fax):           [phone]  I, Yevette Edwards, am acting as a scribe for Dr. Sullivan Lone.   .I have reviewed the above documentation for accuracy and completeness, and I agree with the above. Brunetta Genera MD

## 2019-10-15 NOTE — Telephone Encounter (Signed)
Scheduled appointment per 9/23 los. Patient is aware of upcoming appointment as well as that it is a phone visit. Gave patient updated calendar.

## 2019-10-16 ENCOUNTER — Encounter: Payer: Self-pay | Admitting: Physician Assistant

## 2019-10-23 ENCOUNTER — Other Ambulatory Visit: Payer: Self-pay

## 2019-10-23 ENCOUNTER — Ambulatory Visit (HOSPITAL_COMMUNITY)
Admission: RE | Admit: 2019-10-23 | Discharge: 2019-10-23 | Disposition: A | Discharge status: Home / Self Care | Payer: BC Managed Care – PPO | Source: Ambulatory Visit | Attending: Hematology | Admitting: Hematology

## 2019-10-23 DIAGNOSIS — K118 Other diseases of salivary glands: Secondary | ICD-10-CM | POA: Diagnosis not present

## 2019-10-23 DIAGNOSIS — R918 Other nonspecific abnormal finding of lung field: Secondary | ICD-10-CM | POA: Diagnosis not present

## 2019-10-23 DIAGNOSIS — C884 Extranodal marginal zone B-cell lymphoma of mucosa-associated lymphoid tissue [MALT-lymphoma]: Secondary | ICD-10-CM | POA: Diagnosis not present

## 2019-10-23 DIAGNOSIS — I2510 Atherosclerotic heart disease of native coronary artery without angina pectoris: Secondary | ICD-10-CM | POA: Diagnosis not present

## 2019-10-23 DIAGNOSIS — R911 Solitary pulmonary nodule: Secondary | ICD-10-CM | POA: Diagnosis not present

## 2019-10-23 DIAGNOSIS — N400 Enlarged prostate without lower urinary tract symptoms: Secondary | ICD-10-CM | POA: Diagnosis not present

## 2019-10-23 DIAGNOSIS — I7090 Unspecified atherosclerosis: Secondary | ICD-10-CM | POA: Diagnosis not present

## 2019-10-23 DIAGNOSIS — C8590 Non-Hodgkin lymphoma, unspecified, unspecified site: Secondary | ICD-10-CM | POA: Diagnosis not present

## 2019-10-23 DIAGNOSIS — R161 Splenomegaly, not elsewhere classified: Secondary | ICD-10-CM | POA: Diagnosis not present

## 2019-10-23 MED ORDER — IOHEXOL 300 MG/ML  SOLN
100.0000 mL | Freq: Once | INTRAMUSCULAR | Status: AC | PRN
  Administered 2019-10-23: 100 mL via INTRAVENOUS

## 2019-10-26 ENCOUNTER — Other Ambulatory Visit: Payer: Self-pay | Admitting: Cardiovascular Disease

## 2019-10-26 ENCOUNTER — Telehealth: Payer: Self-pay | Admitting: *Deleted

## 2019-10-26 ENCOUNTER — Encounter: Payer: Self-pay | Admitting: *Deleted

## 2019-10-26 NOTE — Telephone Encounter (Signed)
-----   Message from Arlyss Gandy, PA-C sent at 10/26/2019  8:31 AM EDT ----- Mild. Keep 6 month skin check

## 2019-10-30 ENCOUNTER — Inpatient Hospital Stay: Payer: BC Managed Care – PPO | Attending: Hematology | Admitting: Hematology

## 2019-10-30 NOTE — Progress Notes (Incomplete)
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  HISTORY OF PRESENTING ILLNESS:  Garrett Keller is a wonderful 59 y.o. 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  Patient reports that he had a right parotid mass excised in 2003 but that there was no definitive pathologic diagnosis. He was followed by Dr. Eston Esters from oncology with interval scans but had no new concerns until 2007.  Patient recently presented with a fullness in the left side of the face with a small palpable mass possibly present for several months but was noticed about 2 months ago. He was evaluated by Dr. Constance Holster from ENT on 12/23/2015    He subsequently had a resection of his left parotid mass on 02/01/2016 which showed a MALT lymphoma of the left parotid gland. Patient subsequently had a PET/CT scan on 02/24/2016 which showed Diffuse hypermetabolism throughout [facility] ring which demonstrates diffuse soft tissue thickening (left greater than right). Small adjacent bilateral level IIa lymph nodes demonstrate hypermetabolism, as above. In addition, there is a focal area of soft tissue prominence in the inferior aspect of the left parotid gland immediately inferior to the left external auditory canal extending to the skin surface in the inferior aspect of the left year which demonstrates hypermetabolism. This may represent an additional focus of disease. No evidence of FDG avid lymphadenopathy in the chest or abdomen or focal skeletal lesions .  Patient notes that he has healed well from surgery . He has no specific focal symptoms . No fevers no chills no night sweats no unexpected weight loss . He overall  feels quite well .  INTERVAL HISTORY: I connected with  Garrett Keller on 10/30/19 by telephone and verified that I am speaking with the correct person using two identifiers.   I discussed the limitations of evaluation and management by telemedicine. The patient expressed understanding and agreed to proceed.  Other persons participating in the visit and their role in the encounter: ***  Patient's location: Home Provider's location: [place] [facility] [place]  Garrett Keller is here for f/u for his MALT lymphoma. The patient's last visit with [place] was on 10/15/2019. The pt reports that he is doing well overall.  The pt reports ***  Of note since the patient's last visit, pt has had CT Neck/Chest/Abd/Pel ([id number]) ([id number]) completed on 10/23/2019 with results revealing "Stable neck CT when compared to 2019. No evidence of disease recurrence. 1. Unchanged stranded soft tissue and small nodules or lymph nodes in the anterior mediastinum. No lymphadenopathy in the chest, abdomen, or pelvis. 2. Unchanged mild splenomegaly. 3. Stable, benign irregular 5 mm nodule of the right pulmonary apex. A previously described ground-glass opacity of the anterior right upper lobe is resolved. 4. Prostatomegaly with median lobe hypertrophy. 5. Coronary artery disease."  Lab results (10/15/19) of CBC w/diff and CMP is as follows: all values are WNL except for PLT at 119K, Glucose at 101, ALT at 49. 10/15/2019 LDH at 146  On review of systems, pt reports *** and denies *** and any other symptoms.   A&P: -Discussed pt labwork, 10/15/19; *** -***  MEDICAL HISTORY:  Past Medical History:  Diagnosis Date  . Arthritis   .  Atypical mole 10/15/2019   mild-left upper arm  . Coronary artery calcification seen on CAT scan 01/02/2018  . Exertional dyspnea 01/02/2018  . Pure hypercholesterolemia 01/02/2018  Rt parotid mass excised in 2003 - patient notes there was no definitive diagnosis. Patient was followed by Dr.  Truddie Coco until 2007. 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  SURGICAL HISTORY: Past Surgical History:  Procedure Laterality Date  . CHOLECYSTECTOMY  2008  . salvary gland  2003   large gland inside of cheek    SOCIAL HISTORY: Social History   Socioeconomic History  . Marital status: Married    Spouse name: Not on file  . Number of children: Not on file  . Years of education: Not on file  . Highest education level: Not on file  Occupational History  . Not on file  Tobacco Use  . Smoking status: Never Smoker  . Smokeless tobacco: Never Used  Vaping Use  . Vaping Use: Never used  Substance and Sexual Activity  . Alcohol use: Yes    Comment: weekeneds  . Drug use: No  . Sexual activity: Not on file  Other Topics Concern  . Not on file  Social History Narrative  . Not on file   Social Determinants of Health   Financial Resource Strain:   . Difficulty of Paying Living Expenses: Not on file  Food Insecurity:   . Worried About Charity fundraiser in the Last Year: Not on file  . Ran Out of Food in the Last Year: Not on file  Transportation Needs:   . Lack of Transportation (Medical): Not on file  . Lack of Transportation (Non-Medical): Not on file  Physical Activity:   . Days of Exercise per Week: Not on file  . Minutes of Exercise per Session: Not on file  Stress:   . Feeling of Stress : Not on file  Social Connections:   . Frequency of Communication with Friends and Family: Not on file  . Frequency of Social Gatherings with Friends and Family: Not on file  . Attends Religious Services: Not on file  . Active Member of Clubs or Organizations: Not on file  . Attends Archivist Meetings: Not on file  . Marital Status: Not on file  Intimate Partner Violence:   . Fear of Current or Ex-Partner: Not on  file  . Emotionally Abused: Not on file  . Physically Abused: Not on file  . Sexually Abused: Not on file  Has been a smoker but has had a history of exposure to secondhand smoke . 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 ALLERGIES:  is allergic to adhesive [tape] and atorvastatin.  MEDICATIONS:  Current Outpatient Medications  Medication Sig Dispense Refill  . LORazepam (ATIVAN) 0.5 MG tablet  (Patient not taking: Reported on 10/15/2019)    . rosuvastatin (CRESTOR) 20 MG tablet TAKE 1 TABLET BY MOUTH EVERY DAY 30 tablet 1   No current facility-administered medications for this visit.    REVIEW OF SYSTEMS:   A 10+ POINT REVIEW OF SYSTEMS WAS OBTAINED including neurology, dermatology, psychiatry, cardiac, respiratory, lymph, extremities, GI, GU, Musculoskeletal, constitutional, breasts, reproductive, HEENT.  All pertinent positives are noted in the  HPI.  All others are negative.   PHYSICAL EXAMINATION: ECOG PERFORMANCE STATUS: 0 - Asymptomatic  . There were no vitals filed for this visit. There were no vitals filed for this visit. .There is no height or weight on file to calculate BMI.   Telehealth visit  LABORATORY DATA:  I have reviewed the data as listed  . CBC Latest Ref Rng & Units 10/15/2019 09/18/2018 03/21/2018  WBC 4.0 - 10.5 K/uL 4.1 4.4 3.7(L)  Hemoglobin 13.0 - 17.0 g/dL 15.6 15.7 15.5  Hematocrit 39 - 52 % 45.8 47.3 46.3  Platelets 150 - 400 K/uL 119(L) 133(L) 132(L)    . CMP Latest Ref Rng & Units 10/15/2019 09/18/2018 03/21/2018  Glucose 70 - 99 mg/dL 101(H) 103(H) 99  BUN 8 - 23 mg/dL 16 14 16   Creatinine 0.61 - 1.24 mg/dL 0.97 0.97 1.00  Sodium 135 - 145 mmol/L 139 139 140  Potassium 3.5 - 5.1 mmol/L 4.0 4.2 4.2  Chloride 98 - 111 mmol/L 106 108 106  CO2 22 - 32 mmol/L 27 23 26   Calcium 8.9 - 10.3 mg/dL 9.8  9.0 9.3  Total Protein 6.5 - 8.1 g/dL 8.1 7.9 8.1  Total Bilirubin 0.3 - 1.2 mg/dL 0.6 1.0 1.0  Alkaline Phos 38 - 126 U/L 55 62 54  AST 15 - 41 U/L 29 24 24   ALT 0 - 44 U/L 49(H) 41 38   . Lab Results  Component Value Date   LDH 146 10/15/2019    Component     Latest Ref Rng & Units 03/09/2016  LDH     125 - 245 U/L 158  Hep C Virus Ab     0.0 - 0.9 s/co ratio <0.1  Hepatitis B Surface Ag     Negative Negative  Hep B Core Ab, Tot     Negative Negative        RADIOGRAPHIC STUDIES: I have personally reviewed the radiological images as listed and agreed with the findings in the report. CT Soft Tissue Neck W Contrast  Result Date: 10/23/2019 CLINICAL DATA:  Lymphoma follow-up EXAM: CT NECK WITH CONTRAST TECHNIQUE: Multidetector CT imaging of the neck was performed using the standard protocol following the bolus administration of intravenous contrast. CONTRAST:  159mL OMNIPAQUE IOHEXOL 300 MG/ML  SOLN COMPARISON:  12/13/2017 FINDINGS: Pharynx and larynx: No thickening of Waldeyer's ring. Salivary glands: Fatty atrophy seen diffusely. Asymmetric reticulation at the postoperative left parotid tail which is stable and has a scar-like appearance. Thyroid: Stable and negative Lymph nodes: Conspicuous number of nonenlarged lymph nodes without change from prior. Vascular: Atheromatous calcifications. Limited intracranial: Negative Visualized orbits: Negative Mastoids and visualized paranasal sinuses: Clear Skeleton: No acute or destructive change Upper chest: Reported separately. IMPRESSION: 1. Stable neck CT when compared to 2019. No evidence of disease recurrence. 2. Chest CT reported separately. Electronically Signed   By: Monte Fantasia M.D.   On: 10/23/2019 09:34   CT CHEST ABDOMEN PELVIS W CONTRAST  Result Date: 10/23/2019 CLINICAL DATA:  Marginal zone lymphoma restaging EXAM: CT CHEST, ABDOMEN, AND PELVIS WITH CONTRAST TECHNIQUE: Multidetector CT imaging of the chest, abdomen and  pelvis was performed following the standard protocol during bolus administration of intravenous contrast. CONTRAST:  162mL OMNIPAQUE IOHEXOL 300 MG/ML  SOLN COMPARISON:  CT chest, 09/18/2018, PET-CT, 03/14/2018 FINDINGS: CT CHEST FINDINGS Cardiovascular: Left coronary artery calcifications. Normal heart size. No pericardial effusion. Mediastinum/Nodes: No enlarged mediastinal, hilar, or axillary lymph nodes. Unchanged stranded soft tissue and small nodules or lymph  nodes in the anterior mediastinum measuring no greater than 7 mm (series 2, image 25). Thyroid gland, trachea, and esophagus demonstrate no significant findings. Lungs/Pleura: Stable, benign irregular 5 mm nodule of the right pulmonary apex (series 7, image 30). A previously described ground-glass opacity of the anterior right upper lobe is resolved. No pleural effusion or pneumothorax. Musculoskeletal: No chest wall mass or suspicious bone lesions identified. CT ABDOMEN PELVIS FINDINGS Hepatobiliary: No focal liver abnormality is seen. Status post cholecystectomy. No biliary dilatation. Pancreas: Unremarkable. No pancreatic ductal dilatation or surrounding inflammatory changes. Spleen: Unchanged mild splenomegaly, maximum coronal span 13.7 cm. Adrenals/Urinary Tract: Adrenal glands are unremarkable. Kidneys are normal, without renal calculi, solid lesion, or hydronephrosis. Bladder is unremarkable. Stomach/Bowel: Stomach is within normal limits. Appendix appears normal. No evidence of bowel wall thickening, distention, or inflammatory changes. Vascular/Lymphatic: No significant vascular findings are present. No enlarged abdominal or pelvic lymph nodes. Reproductive: Prostatomegaly with median lobe hypertrophy. Other: No abdominal wall hernia or abnormality. No abdominopelvic ascites. Musculoskeletal: No acute or significant osseous findings. IMPRESSION: 1. Unchanged stranded soft tissue and small nodules or lymph nodes in the anterior mediastinum. No  lymphadenopathy in the chest, abdomen, or pelvis. 2. Unchanged mild splenomegaly. 3. Stable, benign irregular 5 mm nodule of the right pulmonary apex. A previously described ground-glass opacity of the anterior right upper lobe is resolved. 4. Prostatomegaly with median lobe hypertrophy. 5. Coronary artery disease. Electronically Signed   By: Eddie Candle M.D.   On: 10/23/2019 10:41    ASSESSMENT & PLAN:   59 y.o. generally healthy Caucasian male with previous history of right parotid gland lesion resected in 2003 and monitored with scans by Dr. Eston Esters now presents with   1) Left Parotid gland slowly growing mass this was resected by Dr. Constance Holster and pathology is consistent with an extranodal marginal Zone lymphoma of the parotid MALT tissue. PET/CT suggest possible residual lesion in the parotid gland. There are a few small upper cervical LN's and uptake in the [facility] ring   Cervical lymph node biopsy is consistent with MALT lymphoma involvement. After discussing the various treatment options  including observation versus Rituxan monotherapy vs ISRT vs chemo-immunotherapy and the rationale for different treatments. Patient choose to proceed with active surveillance and observation, which is what we would recommend since he does not have much residual MALT lymphoma and had no symptoms attributable to this. Hepatitis profile neg.  12/13/17 CT C/A/P revealed A right upper lobe ground-glass density pulmonary nodule has significantly enlarged over the past year, currently measuring 1.2 by 0.9 cm and formerly 0.5 by 0.7 cm. This slow but progressive growth raises concern for potential low-grade adenocarcinoma. Consider discussion in a multidisciplinary cancer conference setting. Possible further workup might include close surveillance CT imaging, tissue diagnosis, or nuclear medicine PET-CT. 2. Minimal enlargement of a small focus of lingular architectural distortion and ground-glass nodularity,  currently 1.2 by 1.0 cm. 3. The other small solid and sub solid pulmonary nodules appear Stable. 4. No current adenopathy. 5.  Aortic Atherosclerosis.  Coronary atherosclerosis. 6. Moderate prostatomegaly.  12/13/17 CT Neck revealed No recurrent parotid mass or cervical adenopathy. No thickening of Waldeyer's ring. 2. Generalized salivary atrophy with progressive dilatation of the left parotid duct.  03/14/18 PET/CT revealed Volume loss below the right first sternocostal articulation partially obscures the ground-glass density pulmonary nodule of concern in the right upper lobe. A measurement of the nodule independent from the articulation is not feasible, but there is not thought to be  a significant high activity in the nodule at this time. This does not necessarily exclude low-grade adenocarcinoma and surveillance is likely indicated. 2. The focus of architectural distortion and nodularity in the lingula is not hypermetabolic. None of the other smaller nodules are appreciably hypermetabolic although these remain below sensitive PET-CT size thresholds. 3. Centrally accentuated activity at the prostate apex, as before, probably from radiopharmaceutical in the prostatic urethra, although technically nonspecific. Correlate with PSA levels in determining need for further prostate workup. There is moderate prostatomegaly. 4.  Aortic Atherosclerosis.  Coronary atherosclerosis.  2) Pulmonary nodule, as first noted on the above 12/13/17 CT C/A/P  PLAN:  *** -No lab or clinical evidence of MALT lymphoma recurrence at this time. No indication to restart treatment.***    FOLLOW UP: ***   The total time spent in the appt was *** minutes and more than 50% was on counseling and direct patient cares.  All of the patient's questions were answered with apparent satisfaction. The patient knows to call the clinic with any problems, questions or concerns.    Sullivan Lone MD [place] AAHIVMS [facility] Hematology/Oncology  Physician [facility]  (Office):       [phone] (Work cell):  [phone] (Fax):           [phone]  I, Yevette Edwards, am acting as a scribe for Dr. Sullivan Lone.   {Add Barista Statement}

## 2019-11-10 DIAGNOSIS — N419 Inflammatory disease of prostate, unspecified: Secondary | ICD-10-CM | POA: Diagnosis not present

## 2019-11-10 DIAGNOSIS — N138 Other obstructive and reflux uropathy: Secondary | ICD-10-CM | POA: Diagnosis not present

## 2019-11-10 DIAGNOSIS — N401 Enlarged prostate with lower urinary tract symptoms: Secondary | ICD-10-CM | POA: Diagnosis not present

## 2019-11-10 DIAGNOSIS — R972 Elevated prostate specific antigen [PSA]: Secondary | ICD-10-CM | POA: Diagnosis not present

## 2019-11-17 ENCOUNTER — Ambulatory Visit: Payer: BC Managed Care – PPO

## 2019-11-19 ENCOUNTER — Ambulatory Visit: Payer: BC Managed Care – PPO | Admitting: Physician Assistant

## 2019-11-21 ENCOUNTER — Other Ambulatory Visit: Payer: Self-pay | Admitting: Cardiovascular Disease

## 2019-12-01 ENCOUNTER — Ambulatory Visit: Payer: BC Managed Care – PPO | Attending: Internal Medicine

## 2019-12-01 ENCOUNTER — Telehealth: Payer: Self-pay | Admitting: Cardiovascular Disease

## 2019-12-01 ENCOUNTER — Other Ambulatory Visit (HOSPITAL_BASED_OUTPATIENT_CLINIC_OR_DEPARTMENT_OTHER): Payer: Self-pay | Admitting: Internal Medicine

## 2019-12-01 DIAGNOSIS — Z23 Encounter for immunization: Secondary | ICD-10-CM

## 2019-12-01 NOTE — Telephone Encounter (Signed)
Pt c/o medication issue:  1. Name of Medication: rosuvastatin (CRESTOR) 20 MG tablet  2. How are you currently taking this medication (dosage and times per day)? As directed  3. Are you having a reaction (difficulty breathing--STAT)? no  4. What is your medication issue? Patient states that he got a notification that this medication was refilled through mychart. He states he does not get this medication from Dr. Oval Linsey and wants to know what is going on. Please advise.

## 2019-12-01 NOTE — Telephone Encounter (Signed)
Returned call to pt he states :how was I supposed to know when I need to follow up"? Informed pt that on the AVS from the visit it says when to f/u. Informed pt that he was here 11-2017 and was supposed to follow up 2 months later or annually.  Made appt for f/u in jan

## 2019-12-04 MED FILL — PFIZER-BIONTECH COVID-19 VA: 30 | 1 days supply | Qty: 0 | Fill #0

## 2019-12-07 DIAGNOSIS — N138 Other obstructive and reflux uropathy: Secondary | ICD-10-CM | POA: Diagnosis not present

## 2019-12-07 DIAGNOSIS — R972 Elevated prostate specific antigen [PSA]: Secondary | ICD-10-CM | POA: Diagnosis not present

## 2019-12-07 DIAGNOSIS — N401 Enlarged prostate with lower urinary tract symptoms: Secondary | ICD-10-CM | POA: Diagnosis not present

## 2019-12-09 DIAGNOSIS — C8300 Small cell B-cell lymphoma, unspecified site: Secondary | ICD-10-CM | POA: Diagnosis not present

## 2019-12-09 DIAGNOSIS — C884 Extranodal marginal zone B-cell lymphoma of mucosa-associated lymphoid tissue [MALT-lymphoma]: Secondary | ICD-10-CM | POA: Diagnosis not present

## 2019-12-09 DIAGNOSIS — R590 Localized enlarged lymph nodes: Secondary | ICD-10-CM | POA: Diagnosis not present

## 2019-12-30 DIAGNOSIS — N401 Enlarged prostate with lower urinary tract symptoms: Secondary | ICD-10-CM | POA: Diagnosis not present

## 2019-12-30 DIAGNOSIS — R972 Elevated prostate specific antigen [PSA]: Secondary | ICD-10-CM | POA: Diagnosis not present

## 2019-12-30 DIAGNOSIS — N138 Other obstructive and reflux uropathy: Secondary | ICD-10-CM | POA: Diagnosis not present

## 2019-12-30 DIAGNOSIS — N419 Inflammatory disease of prostate, unspecified: Secondary | ICD-10-CM | POA: Diagnosis not present

## 2020-01-11 DIAGNOSIS — R972 Elevated prostate specific antigen [PSA]: Secondary | ICD-10-CM | POA: Diagnosis not present

## 2020-01-11 DIAGNOSIS — N401 Enlarged prostate with lower urinary tract symptoms: Secondary | ICD-10-CM | POA: Diagnosis not present

## 2020-01-11 DIAGNOSIS — N138 Other obstructive and reflux uropathy: Secondary | ICD-10-CM | POA: Diagnosis not present

## 2020-02-14 ENCOUNTER — Other Ambulatory Visit: Payer: Self-pay | Admitting: Cardiovascular Disease

## 2020-02-15 ENCOUNTER — Ambulatory Visit (INDEPENDENT_AMBULATORY_CARE_PROVIDER_SITE_OTHER): Payer: BC Managed Care – PPO | Admitting: Cardiovascular Disease

## 2020-02-15 ENCOUNTER — Encounter: Payer: Self-pay | Admitting: Cardiovascular Disease

## 2020-02-15 ENCOUNTER — Other Ambulatory Visit: Payer: Self-pay

## 2020-02-15 VITALS — BP 130/82 | HR 62 | Ht 70.0 in | Wt 240.0 lb

## 2020-02-15 DIAGNOSIS — I2510 Atherosclerotic heart disease of native coronary artery without angina pectoris: Secondary | ICD-10-CM

## 2020-02-15 NOTE — Progress Notes (Signed)
Cardiology Office Note   Date:  02/15/2020   ID:  Garrett Keller, DOB 1960-10-04, MRN [id number]  PCP:  Garrett Lass, MD  Cardiologist:   Garrett Latch, MD   No chief complaint on file.   History of Present Illness: Garrett Keller is a 60 y.o. male with coronary calcification on chest CT, hyperlipidemia, prior MALT lymphoma, and GERD here for follow up.  He was initially seen in 2019 for the evaluation of shortness of breath. Mr. Merlo has a history of MALT lymphoma.   He initially underwent resection of the right parotid in 2003.  In 2017 he had a mass removed from his left neck and several lymph nodes removed.  He did not require any chemotherapy or radiation.  He follows with Dr. Irene Keller and was noted to have some small lymphadenopathy that they are monitoring on chest CT.  He had a chest CT and was noted to have atherosclerosis of the aorta and the LAD.  He was very active but has noted some shortness of breath.  He was referred for an ETT 12/2017 that was negative for ischemia.  He achieved 13 METS on a Bruce protocol.  Since his last appointment Mr. Choung has been doing well.  He has been walking a lot and gets about 10,000 steps daily.  He has no exertional chest pain and his breathing is good.  He denies lower extremity edema, orthopnea, or PND.  His only complaint right now is that he has enlarged prostate.  He needs to have surgery.  He struggles with frequent nocturia and then has a hard time going back to sleep.  He denies any lower extremity edema, orthopnea, or PND.  He notes his weight is up a little bit which he attributes to not being as active during the winter.  He does not check his blood pressure regularly at home.  He mostly cooks at home and does not add any salt to his food   Past Medical History:  Diagnosis Date  . Arthritis   . Atypical mole 10/15/2019   mild-left upper arm  . Coronary artery calcification seen on CAT scan 01/02/2018  . Exertional dyspnea  01/02/2018  . Pure hypercholesterolemia 01/02/2018    Past Surgical History:  Procedure Laterality Date  . CHOLECYSTECTOMY  2008  . salvary gland  2003   large gland inside of cheek     Current Outpatient Medications  Medication Sig Dispense Refill  . aspirin 81 MG EC tablet Take by mouth. Take 2    . citalopram (CELEXA) 40 MG tablet Take 1 tablet by mouth daily.    Marland Kitchen LORazepam (ATIVAN) 0.5 MG tablet     . rosuvastatin (CRESTOR) 20 MG tablet TAKE 1 TABLET BY MOUTH EVERY DAY 60 tablet 1  . tamsulosin (FLOMAX) 0.4 MG CAPS capsule Take by mouth.     No current facility-administered medications for this visit.    Allergies:   Adhesive [tape] and Atorvastatin    Social History:  The patient  reports that he has never smoked. He has never used smokeless tobacco. He reports current alcohol use. He reports that he does not use drugs.   Family History:  The patient's family history is not on file. He was adopted.  Patient is adopted.    ROS:  Please see the history of present illness.   Otherwise, review of systems are positive for none.   All other systems are reviewed and negative.    PHYSICAL  EXAM: VS:  BP 130/82   Pulse 62   Ht 5\' 10"  (1.778 m)   Wt 240 lb (108.9 kg)   BMI 34.44 kg/m  , BMI Body mass index is 34.44 kg/m. GENERAL:  Well appearing HEENT: Pupils equal round and reactive, fundi not visualized, oral mucosa unremarkable NECK:  No jugular venous distention, waveform within normal limits, carotid upstroke brisk and symmetric, no bruits LUNGS:  Clear to auscultation bilaterally HEART:  RRR.  PMI not displaced or sustained,S1 and S2 within normal limits, no S3, no S4, no clicks, no rubs, no murmurs ABD:  Flat, positive bowel sounds normal in frequency in pitch, no bruits, no rebound, no guarding, no midline pulsatile mass, no hepatomegaly, no splenomegaly EXT:  2 plus pulses throughout, no edema, no cyanosis no clubbing SKIN:  No rashes no nodules NEURO:  Cranial  nerves II through XII grossly intact, motor grossly intact throughout PSYCH:  Cognitively intact, oriented to person place and time   EKG:  EKG is ordered today. The ekg ordered 01/02/18 demonstrates sinus bradycardia.  Rate 59 bpm. 02/15/20: Sinus rhythm.  Rate 62 bpm  ETT 12/2017 Clinically and electrically negative for ischemia  Excellent exercise tolerance  Recent Labs: 10/15/2019: ALT 49; BUN 16; Creatinine 0.97; Hemoglobin 15.6; Platelets 119; Potassium 4.0; Sodium 139   06/05/2017: Total cholesterol 185, triglycerides 180, HDL 45, LDL 104 Sodium 138, potassium 4.5, BUN 13, creatinine AST 20, ALT 29   Lipid Panel    Component Value Date/Time   CHOL 154 03/21/2018 0000   TRIG 97 03/21/2018 0000   HDL 39 (L) 03/21/2018 0000   CHOLHDL 3.9 03/21/2018 0000   LDLCALC 96 03/21/2018 0000      Wt Readings from Last 3 Encounters:  02/15/20 240 lb (108.9 kg)  10/15/19 248 lb (112.5 kg)  03/21/18 239 lb 12.8 oz (108.8 kg)      ASSESSMENT AND PLAN:  # Athereosclerosis of the aorta and LAD: # Hyperlipidemia: Mr. Lemmerman is exercising regularly and feels well.  He had a normal stress test in 2019.  LDL goal is less than 70.  Most recently his LDL was 71.  Continue working on diet and exercise.  Continue rosuvastatin.  He takes aspirin 81 mg daily.  # Elevated BP:   Blood pressure very slightly elevated.  Continue working on diet and exercise.  Continue to limit sodium intake.   Current medicines are reviewed at length with the patient today.  The patient does not have concerns regarding medicines.  The following changes have been made:  Increase atorvastatin.  Start aspirin.   Labs/ tests ordered today include:   No orders of the defined types were placed in this encounter.    Disposition:   FU with Montravious Weigelt C. Oval Linsey, MD, [facility] as needed.      Signed, Royden Bulman C. Oval Linsey, MD, [facility]  02/15/2020 3:18 PM    [facility]

## 2020-02-15 NOTE — Patient Instructions (Signed)
Medication Instructions:  ?Your physician recommends that you continue on your current medications as directed. Please refer to the Current Medication list given to you today.  ? ?Labwork: ?NONE ? ?Testing/Procedures: ?NONE ? ?Follow-Up: ?AS NEEDED  ? ?  ?

## 2020-02-23 DIAGNOSIS — R351 Nocturia: Secondary | ICD-10-CM | POA: Diagnosis not present

## 2020-02-23 DIAGNOSIS — N401 Enlarged prostate with lower urinary tract symptoms: Secondary | ICD-10-CM | POA: Diagnosis not present

## 2020-02-23 DIAGNOSIS — R350 Frequency of micturition: Secondary | ICD-10-CM | POA: Diagnosis not present

## 2020-02-23 DIAGNOSIS — N138 Other obstructive and reflux uropathy: Secondary | ICD-10-CM | POA: Diagnosis not present

## 2020-02-23 DIAGNOSIS — N3943 Post-void dribbling: Secondary | ICD-10-CM | POA: Diagnosis not present

## 2020-03-11 DIAGNOSIS — K219 Gastro-esophageal reflux disease without esophagitis: Secondary | ICD-10-CM | POA: Diagnosis not present

## 2020-03-11 DIAGNOSIS — D696 Thrombocytopenia, unspecified: Secondary | ICD-10-CM | POA: Diagnosis not present

## 2020-03-11 DIAGNOSIS — Z9889 Other specified postprocedural states: Secondary | ICD-10-CM | POA: Diagnosis not present

## 2020-03-11 DIAGNOSIS — C884 Extranodal marginal zone B-cell lymphoma of mucosa-associated lymphoid tissue [MALT-lymphoma]: Secondary | ICD-10-CM | POA: Diagnosis not present

## 2020-03-11 DIAGNOSIS — N400 Enlarged prostate without lower urinary tract symptoms: Secondary | ICD-10-CM | POA: Diagnosis not present

## 2020-03-11 DIAGNOSIS — E785 Hyperlipidemia, unspecified: Secondary | ICD-10-CM | POA: Diagnosis not present

## 2020-03-11 DIAGNOSIS — N401 Enlarged prostate with lower urinary tract symptoms: Secondary | ICD-10-CM | POA: Diagnosis not present

## 2020-03-11 DIAGNOSIS — I10 Essential (primary) hypertension: Secondary | ICD-10-CM | POA: Diagnosis not present

## 2020-03-11 DIAGNOSIS — N138 Other obstructive and reflux uropathy: Secondary | ICD-10-CM | POA: Diagnosis not present

## 2020-03-11 DIAGNOSIS — N411 Chronic prostatitis: Secondary | ICD-10-CM | POA: Diagnosis not present

## 2020-03-12 DIAGNOSIS — K219 Gastro-esophageal reflux disease without esophagitis: Secondary | ICD-10-CM | POA: Diagnosis not present

## 2020-03-12 DIAGNOSIS — E785 Hyperlipidemia, unspecified: Secondary | ICD-10-CM | POA: Diagnosis not present

## 2020-03-12 DIAGNOSIS — C884 Extranodal marginal zone B-cell lymphoma of mucosa-associated lymphoid tissue [MALT-lymphoma]: Secondary | ICD-10-CM | POA: Diagnosis not present

## 2020-03-12 DIAGNOSIS — I10 Essential (primary) hypertension: Secondary | ICD-10-CM | POA: Diagnosis not present

## 2020-03-12 DIAGNOSIS — N138 Other obstructive and reflux uropathy: Secondary | ICD-10-CM | POA: Diagnosis not present

## 2020-03-12 DIAGNOSIS — N401 Enlarged prostate with lower urinary tract symptoms: Secondary | ICD-10-CM | POA: Diagnosis not present

## 2020-03-12 DIAGNOSIS — Z9889 Other specified postprocedural states: Secondary | ICD-10-CM | POA: Diagnosis not present

## 2020-03-12 DIAGNOSIS — D696 Thrombocytopenia, unspecified: Secondary | ICD-10-CM | POA: Diagnosis not present

## 2020-03-13 DIAGNOSIS — D696 Thrombocytopenia, unspecified: Secondary | ICD-10-CM | POA: Diagnosis not present

## 2020-03-13 DIAGNOSIS — N138 Other obstructive and reflux uropathy: Secondary | ICD-10-CM | POA: Diagnosis not present

## 2020-03-13 DIAGNOSIS — I10 Essential (primary) hypertension: Secondary | ICD-10-CM | POA: Diagnosis not present

## 2020-03-13 DIAGNOSIS — Z9889 Other specified postprocedural states: Secondary | ICD-10-CM | POA: Diagnosis not present

## 2020-03-13 DIAGNOSIS — N401 Enlarged prostate with lower urinary tract symptoms: Secondary | ICD-10-CM | POA: Diagnosis not present

## 2020-03-13 DIAGNOSIS — C884 Extranodal marginal zone B-cell lymphoma of mucosa-associated lymphoid tissue [MALT-lymphoma]: Secondary | ICD-10-CM | POA: Diagnosis not present

## 2020-03-13 DIAGNOSIS — Z4659 Encounter for fitting and adjustment of other gastrointestinal appliance and device: Secondary | ICD-10-CM | POA: Diagnosis not present

## 2020-03-13 DIAGNOSIS — E785 Hyperlipidemia, unspecified: Secondary | ICD-10-CM | POA: Diagnosis not present

## 2020-03-13 DIAGNOSIS — K219 Gastro-esophageal reflux disease without esophagitis: Secondary | ICD-10-CM | POA: Diagnosis not present

## 2020-03-14 DIAGNOSIS — C884 Extranodal marginal zone B-cell lymphoma of mucosa-associated lymphoid tissue [MALT-lymphoma]: Secondary | ICD-10-CM | POA: Diagnosis not present

## 2020-03-14 DIAGNOSIS — I10 Essential (primary) hypertension: Secondary | ICD-10-CM | POA: Diagnosis not present

## 2020-03-14 DIAGNOSIS — N401 Enlarged prostate with lower urinary tract symptoms: Secondary | ICD-10-CM | POA: Diagnosis not present

## 2020-03-14 DIAGNOSIS — N138 Other obstructive and reflux uropathy: Secondary | ICD-10-CM | POA: Diagnosis not present

## 2020-03-14 DIAGNOSIS — K219 Gastro-esophageal reflux disease without esophagitis: Secondary | ICD-10-CM | POA: Diagnosis not present

## 2020-03-14 DIAGNOSIS — E785 Hyperlipidemia, unspecified: Secondary | ICD-10-CM | POA: Insufficient documentation

## 2020-03-14 DIAGNOSIS — D696 Thrombocytopenia, unspecified: Secondary | ICD-10-CM | POA: Insufficient documentation

## 2020-03-14 DIAGNOSIS — Z9889 Other specified postprocedural states: Secondary | ICD-10-CM | POA: Diagnosis not present

## 2020-03-22 DIAGNOSIS — R339 Retention of urine, unspecified: Secondary | ICD-10-CM | POA: Diagnosis not present

## 2020-03-22 DIAGNOSIS — Z466 Encounter for fitting and adjustment of urinary device: Secondary | ICD-10-CM | POA: Diagnosis not present

## 2020-03-26 ENCOUNTER — Emergency Department (HOSPITAL_BASED_OUTPATIENT_CLINIC_OR_DEPARTMENT_OTHER)
Admission: EM | Admit: 2020-03-26 | Discharge: 2020-03-26 | Disposition: A | Discharge status: Home / Self Care | Payer: BC Managed Care – PPO | Attending: Emergency Medicine | Admitting: Emergency Medicine

## 2020-03-26 ENCOUNTER — Other Ambulatory Visit: Payer: Self-pay

## 2020-03-26 ENCOUNTER — Emergency Department (HOSPITAL_BASED_OUTPATIENT_CLINIC_OR_DEPARTMENT_OTHER): Payer: BC Managed Care – PPO

## 2020-03-26 DIAGNOSIS — N433 Hydrocele, unspecified: Secondary | ICD-10-CM | POA: Diagnosis not present

## 2020-03-26 DIAGNOSIS — N451 Epididymitis: Secondary | ICD-10-CM

## 2020-03-26 DIAGNOSIS — N50812 Left testicular pain: Secondary | ICD-10-CM | POA: Diagnosis not present

## 2020-03-26 DIAGNOSIS — Z7982 Long term (current) use of aspirin: Secondary | ICD-10-CM | POA: Diagnosis not present

## 2020-03-26 DIAGNOSIS — R9431 Abnormal electrocardiogram [ECG] [EKG]: Secondary | ICD-10-CM | POA: Diagnosis not present

## 2020-03-26 LAB — CBC WITH DIFFERENTIAL/PLATELET
Abs Immature Granulocytes: 0.05 10*3/uL (ref 0.00–0.07)
Basophils Absolute: 0 10*3/uL (ref 0.0–0.1)
Basophils Relative: 0 %
Eosinophils Absolute: 0 10*3/uL (ref 0.0–0.5)
Eosinophils Relative: 0 %
HCT: 39.4 % (ref 39.0–52.0)
Hemoglobin: 13.4 g/dL (ref 13.0–17.0)
Immature Granulocytes: 0 %
Lymphocytes Relative: 6 %
Lymphs Abs: 0.7 10*3/uL (ref 0.7–4.0)
MCH: 29.4 pg (ref 26.0–34.0)
MCHC: 34 g/dL (ref 30.0–36.0)
MCV: 86.4 fL (ref 80.0–100.0)
Monocytes Absolute: 0.8 10*3/uL (ref 0.1–1.0)
Monocytes Relative: 7 %
Neutro Abs: 10.3 10*3/uL — ABNORMAL HIGH (ref 1.7–7.7)
Neutrophils Relative %: 87 %
Platelets: 205 10*3/uL (ref 150–400)
RBC: 4.56 MIL/uL (ref 4.22–5.81)
RDW: 12.1 % (ref 11.5–15.5)
WBC: 11.8 10*3/uL — ABNORMAL HIGH (ref 4.0–10.5)
nRBC: 0 % (ref 0.0–0.2)

## 2020-03-26 LAB — LACTIC ACID, PLASMA: Lactic Acid, Venous: 1 mmol/L (ref 0.5–1.9)

## 2020-03-26 LAB — COMPREHENSIVE METABOLIC PANEL
ALT: 30 U/L (ref 0–44)
AST: 19 U/L (ref 15–41)
Albumin: 3.7 g/dL (ref 3.5–5.0)
Alkaline Phosphatase: 44 U/L (ref 38–126)
Anion gap: 10 (ref 5–15)
BUN: 22 mg/dL (ref 8–23)
CO2: 22 mmol/L (ref 22–32)
Calcium: 9.2 mg/dL (ref 8.9–10.3)
Chloride: 105 mmol/L (ref 98–111)
Creatinine, Ser: 1.18 mg/dL (ref 0.61–1.24)
GFR, Estimated: >60 mL/min (ref >60)
Glucose, Bld: 118 mg/dL — ABNORMAL HIGH (ref 70–99)
Potassium: 3.6 mmol/L (ref 3.5–5.1)
Sodium: 137 mmol/L (ref 135–145)
Total Bilirubin: 0.7 mg/dL (ref 0.3–1.2)
Total Protein: 7.6 g/dL (ref 6.5–8.1)

## 2020-03-26 LAB — URINALYSIS, ROUTINE W REFLEX MICROSCOPIC
Bilirubin Urine: NEGATIVE
Glucose, UA: NEGATIVE mg/dL
Ketones, ur: NEGATIVE mg/dL
Nitrite: NEGATIVE
Protein, ur: 30 mg/dL — AB
Specific Gravity, Urine: 1.02 (ref 1.005–1.030)
pH: 6.5 (ref 5.0–8.0)

## 2020-03-26 LAB — URINALYSIS, MICROSCOPIC (REFLEX): WBC, UA: >50 WBC/hpf (ref 0–5)

## 2020-03-26 LAB — LIPASE, BLOOD: Lipase: 49 U/L (ref 11–51)

## 2020-03-26 MED ORDER — SODIUM CHLORIDE 0.9 % IV BOLUS
1000.0000 mL | Freq: Once | INTRAVENOUS | Status: AC
  Administered 2020-03-26: 1000 mL via INTRAVENOUS

## 2020-03-26 MED ORDER — ONDANSETRON HCL 4 MG/2ML IJ SOLN
4.0000 mg | Freq: Once | INTRAMUSCULAR | Status: AC
  Administered 2020-03-26: 4 mg via INTRAVENOUS
  Filled 2020-03-26: qty 2

## 2020-03-26 MED ORDER — LEVOFLOXACIN 500 MG PO TABS: 500.0000 mg | ORAL_TABLET | Freq: Every day | ORAL | 0 refills | Status: AC

## 2020-03-26 MED ORDER — FENTANYL CITRATE (PF) 100 MCG/2ML IJ SOLN
50.0000 ug | Freq: Once | INTRAMUSCULAR | Status: AC
  Administered 2020-03-26: 50 ug via INTRAVENOUS
  Filled 2020-03-26: qty 2

## 2020-03-26 MED ORDER — OXYCODONE-ACETAMINOPHEN 5-325 MG PO TABS: 1.0000 | ORAL_TABLET | ORAL | 0 refills | Status: AC | PRN

## 2020-03-26 MED ORDER — OXYCODONE-ACETAMINOPHEN 5-325 MG PO TABS
1.0000 | ORAL_TABLET | Freq: Once | ORAL | Status: AC
  Administered 2020-03-26: 1 via ORAL
  Filled 2020-03-26: qty 1

## 2020-03-26 MED ORDER — LEVOFLOXACIN IN D5W 750 MG/150ML IV SOLN
750.0000 mg | Freq: Once | INTRAVENOUS | Status: AC
  Administered 2020-03-26: 750 mg via INTRAVENOUS
  Filled 2020-03-26: qty 150

## 2020-03-26 MED ORDER — METOCLOPRAMIDE HCL 5 MG/ML IJ SOLN
5.0000 mg | Freq: Once | INTRAMUSCULAR | Status: AC
  Administered 2020-03-26: 5 mg via INTRAVENOUS
  Filled 2020-03-26: qty 2

## 2020-03-26 MED ORDER — SODIUM CHLORIDE 0.9 % IV SOLN
INTRAVENOUS | Status: DC | PRN
  Administered 2020-03-26: 250 mL via INTRAVENOUS

## 2020-03-26 NOTE — ED Notes (Signed)
Assumed care of this patient. Vitals taken. A&O4. Respirations regular/unlabored. Connected to cardiac monitor, bp, pulse ox. Stretcher low, wheels locked, call bell within reach. Family at bedside.

## 2020-03-26 NOTE — ED Provider Notes (Signed)
[facility] EMERGENCY DEPARTMENT Provider Note   CSN: [id number] Arrival date & time: 03/26/20  1420     History Chief Complaint  Patient presents with  . Abdominal Pain    Garrett Keller is a 60 y.o. male.   Presenting to the emergency room with concern for testicle pain, burning with urination, chills, generalized fatigue.  Patient reports that he had a fever on Monday night but subsequently did not have any fevers throughout the rest of the week.  Yesterday he felt generally fatigued, somewhat nauseated.  Worsening throughout the day today.  Also feeling somewhat lightheaded.  Noted some pain in his left testicle.  Since his surgery, he has had difficulty with incontinence.  No change in this.  Has some pain in his low abdomen around his bladder but denies generalized abdominal pain.  Pain primarily in testicle.  Sharp stabbing pain.  Took Motrin with some relief.  Completed chart review, in February patient had a robotic prostatectomy at [facility] with Dr. Brendia Sacks.   HPI     Past Medical History:  Diagnosis Date  . Arthritis   . Atypical mole 10/15/2019   mild-left upper arm  . Coronary artery calcification seen on CAT scan 01/02/2018  . Exertional dyspnea 01/02/2018  . Pure hypercholesterolemia 01/02/2018    Patient Active Problem List   Diagnosis Date Noted  . Coronary artery calcification seen on CAT scan 01/02/2018  . Pure hypercholesterolemia 01/02/2018  . Exertional dyspnea 01/02/2018  . 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 Elevated prostate specific antigen (PSA) 03/14/2015  . Axillary abscess 11/04/2012    Past Surgical History:  Procedure Laterality Date  . CHOLECYSTECTOMY  2008  . salvary gland  2003   large gland inside of cheek       Family History  Adopted: Yes    Social History   Tobacco Use  . Smoking status: Never Smoker  . Smokeless tobacco: Never Used  Vaping Use  . Vaping Use: Never used  Substance Use Topics  . Alcohol  use: Yes    Comment: weekeneds  . Drug use: No    Home Medications Prior to Admission medications   Medication Sig Start Date End Date Taking? Authorizing Provider  levofloxacin (LEVAQUIN) 500 MG tablet Take 1 tablet (500 mg total) by mouth daily for 10 days. 03/26/20 04/05/20 Yes Krupa Stege, Ellwood Dense, MD  oxyCODONE-acetaminophen (PERCOCET/ROXICET) 5-325 MG tablet Take 1 tablet by mouth every 4 (four) hours as needed for up to 3 days for severe pain. 03/26/20 03/29/20 Yes Lucrezia Starch, MD  aspirin 81 MG EC tablet Take by mouth. Take 2    [provider]  citalopram (CELEXA) 40 MG tablet Take 1 tablet by mouth daily. 12/29/14   [provider]  LORazepam (ATIVAN) 0.5 MG tablet  11/03/12   [provider]  rosuvastatin (CRESTOR) 20 MG tablet TAKE 1 TABLET BY MOUTH EVERY DAY 02/15/20   Skeet Latch, MD  tamsulosin [facility]) 0.4 MG CAPS capsule Take by mouth. 01/11/20   [provider]    Allergies    Adhesive [tape] and Atorvastatin  Review of Systems   Review of Systems  Constitutional: Positive for chills and fatigue. Negative for fever.  HENT: Negative for ear pain and sore throat.   Eyes: Negative for pain and visual disturbance.  Respiratory: Negative for cough and shortness of breath.   Cardiovascular: Negative for chest pain and palpitations.  Gastrointestinal: Positive for abdominal pain. Negative for vomiting.  Genitourinary:  Positive for testicular pain. Negative for dysuria and hematuria.  Musculoskeletal: Negative for arthralgias and back pain.  Skin: Negative for color change and rash.  Neurological: Negative for seizures and syncope.  All other systems reviewed and are negative.   Physical Exam Updated Vital Signs BP 108/67   Pulse 87   Temp 100.3 F (37.9 C) (Oral)   Resp 17   Ht 5\' 10"  (1.778 m)   Wt 103.9 kg   SpO2 95%   BMI 32.87 kg/m   Physical Exam Vitals and nursing note reviewed. Exam conducted with a chaperone  present.  Constitutional:      Appearance: He is well-developed and well-nourished.  HENT:     Head: Normocephalic and atraumatic.  Eyes:     Conjunctiva/sclera: Conjunctivae normal.  Cardiovascular:     Rate and Rhythm: Normal rate and regular rhythm.     Heart sounds: No murmur heard.   Pulmonary:     Effort: Pulmonary effort is normal. No respiratory distress.     Breath sounds: Normal breath sounds.  Abdominal:     Palpations: Abdomen is soft.     Tenderness: There is no abdominal tenderness.  Genitourinary:    Comments: RN chaperone  Tenderness to palpation left testicle, no significant swelling noted, no crepitus over testicles, no tenderness over the perineum, no significant redness noted, right testicle has no tenderness Musculoskeletal:        General: No edema.     Cervical back: Neck supple.  Skin:    General: Skin is warm and dry.  Neurological:     General: No focal deficit present.     Mental Status: He is alert.  Psychiatric:        Mood and Affect: Mood and affect normal.     ED Results / Procedures / Treatments   Labs (all labs ordered are listed, but only abnormal results are displayed) Labs Reviewed  URINALYSIS, ROUTINE W REFLEX MICROSCOPIC - Abnormal; Notable for the following components:      Result Value   APPearance CLOUDY (*)    Hgb urine dipstick LARGE (*)    Protein, ur 30 (*)    Leukocytes,Ua LARGE (*)    All other components within normal limits  CBC WITH DIFFERENTIAL/PLATELET - Abnormal; Notable for the following components:   WBC 11.8 (*)    Neutro Abs 10.3 (*)    All other components within normal limits  COMPREHENSIVE METABOLIC PANEL - Abnormal; Notable for the following components:   Glucose, Bld 118 (*)    All other components within normal limits  URINALYSIS, MICROSCOPIC (REFLEX) - Abnormal; Notable for the following components:   Bacteria, UA MANY (*)    All other components within normal limits  CULTURE, BLOOD (ROUTINE X 2)   CULTURE, BLOOD (ROUTINE X 2)  URINE CULTURE  LACTIC ACID, PLASMA  LIPASE, BLOOD  LACTIC ACID, PLASMA    EKG EKG Interpretation  Date/Time:  Saturday March 26 2020 15:06:18 EST Ventricular Rate:  82 PR Interval:    QRS Duration: 88 QT Interval:  352 QTC Calculation: 412 R Axis:   61 Text Interpretation: Sinus rhythm Confirmed by Madalyn Rob [id number]) on 03/26/2020 3:25:07 PM   Radiology US SCROTUM W/DOPPLER  Addendum Date: 03/26/2020   ADDENDUM REPORT: 03/26/2020 16:47 ADDENDUM: These results were called by telephone at the time of interpretation on 03/26/2020 at 4:46 pm to provider [facility] , who verbally acknowledged these results. Electronically Signed   By: Clelia Croft.D.  On: 03/26/2020 16:47   Result Date: 03/26/2020 CLINICAL DATA:  Left testicular pain with nausea today. Has had chills/mild fever/pelvic pain/headache this week. Prostatectomy 03/11/20 w/foley removed 03/22/20 EXAM: SCROTAL ULTRASOUND DOPPLER ULTRASOUND OF THE TESTICLES TECHNIQUE: Complete ultrasound examination of the testicles, epididymis, and other scrotal structures was performed. Color and spectral Doppler ultrasound were also utilized to evaluate blood flow to the testicles. COMPARISON:  PET CT 03/14/2018 FINDINGS: Right testicle Measurements: 4.3 x 2.7 x 3 cm. No mass. Several microlithiasis visualized. Left testicle Measurements: 4 x 2.7 x 2.9 cm. No mass. Several microlithiasis visualized. Right epididymis: Epididymal head cysts. Otherwise normal in size and appearance. Left epididymis: The left epididymal tail is heterogeneous with increased vascularity on color Doppler flow. The remainder of the epididymis is unremarkable. Hydrocele: Bilateral hydroceles with internal echoes noted within the left hydrocele. Varicocele:  None visualized. Pulsed Doppler interrogation of both testes demonstrates normal low resistance arterial and venous waveforms bilaterally. IMPRESSION: 1. Left epididymitis. Associated  left hydrocele with internal echoes and possible septations that could represent a pyocele. No abscess formation identified. 2. Bilateral microlithiasis. Current literature suggests that testicular microlithiasis is not a significant independent risk factor for development of testicular carcinoma, and that follow up imaging is not warranted in the absence of other risk factors. Monthly testicular self-examination and annual physical exams are considered appropriate surveillance. If patient has other risk factors for testicular carcinoma, then referral to Urology should be considered. (Reference: DeCastro, et al.: A 5-Year Follow up Study of Asymptomatic Men with Testicular Microlithiasis. J Urol 2008; 557:3220-2542.). 3. Otherwise unremarkable scrotal ultrasound. Electronically Signed: By: Iven Finn M.D. On: 03/26/2020 16:42    Procedures Procedures   Medications Ordered in ED Medications  0.9 %  sodium chloride infusion (250 mLs Intravenous New Bag/Given 03/26/20 1850)  ondansetron (ZOFRAN) injection 4 mg (4 mg Intravenous Given 03/26/20 1619)  sodium chloride 0.9 % bolus 1,000 mL (0 mLs Intravenous Stopped 03/26/20 1749)  levofloxacin (LEVAQUIN) IVPB 750 mg (750 mg Intravenous New Bag/Given 03/26/20 1854)  fentaNYL (SUBLIMAZE) injection 50 mcg (50 mcg Intravenous Given 03/26/20 1846)  metoCLOPramide (REGLAN) injection 5 mg (5 mg Intravenous Given 03/26/20 1855)  sodium chloride 0.9 % bolus 1,000 mL (0 mLs Intravenous Stopped 03/26/20 2007)  oxyCODONE-acetaminophen (PERCOCET/ROXICET) 5-325 MG per tablet 1 tablet (1 tablet Oral Given 03/26/20 2031)    ED Course  I have reviewed the triage vital signs and the nursing notes.  Pertinent labs & imaging results that were available during my care of the patient were reviewed by me and considered in my medical decision making (see chart for details).    MDM Rules/Calculators/A&P                         60 year old male presents to ER with concern for  testicle pain, malaise.  On exam, patient was overall well-appearing with stable vital signs but noted to have low-grade temperature.  Given recent surgical history, obtain broad work-up with labs, blood cultures, urine and urine culture.  Given the tenderness of his left testicle, checked ultrasound.  No abdominal tenderness noted on exam.  Lab work is grossly stable.  UA demonstrating findings consistent with UTI.  Scrotal ultrasound negative for torsion but did demonstrate findings consistent with epididymitis, possible pyocele but no evidence for abscess formation.  I reviewed these findings with on-call urology, Dr. Alinda Money.  Recommended dose of IV antibiotics in ER, DC on fluoroquinolone and follow-up in patient's primary urology  clinic next week.  Patient was provided pain, nausea medicine, fluids and dose of IV levofloxacin.  After period of observation, patient's symptoms had markedly improved.  Repeat temperature was normal.  He is tolerating p.o. without difficulty.  Given conversation with urology, work-up, patient's current clinical appearance, believe he is appropriate for outpatient management.  Recommended close follow-up with his urologist and reviewed strict return precautions with patient and wife.   After the discussed management above, the patient was determined to be safe for discharge.  The patient was in agreement with this plan and all questions regarding their care were answered.  ED return precautions were discussed and the patient will return to the ED with any significant worsening of condition.  Final Clinical Impression(s) / ED Diagnoses Final diagnoses:  Epididymitis    Rx / DC Orders ED Discharge Orders         Ordered    levofloxacin (LEVAQUIN) 500 MG tablet  Daily        03/26/20 2032    oxyCODONE-acetaminophen (PERCOCET/ROXICET) 5-325 MG tablet  Every 4 hours PRN        03/26/20 2032           Lucrezia Starch, MD 03/26/20 2101

## 2020-03-26 NOTE — Discharge Instructions (Addendum)
Please follow-up with your urologist on Monday.    Continue the antibiotics.  Your next dose should be taken tomorrow.  If you develop fever, vomiting, significant worsening abdominal or testicle pain or other new concerning symptom, return to ER for reassessment.  Take Tylenol or Motrin for pain control.  For breakthrough pain, take Percocet as needed.  Note this can make you drowsy and should not be taken while driving or operating heavy machinery.

## 2020-03-26 NOTE — ED Triage Notes (Signed)
Pt had a foley placed after prostatectomy which was removed on 3/1. Pt had a fever Monday night. Today he is weak, feels dizzy, and has abdominal pain and nausea. His wife states he had a +home test for UTI.

## 2020-03-26 NOTE — ED Notes (Signed)
About 60 mls eliminated naturally through urination, darker amber color, cloudy. Post void 3 scans, 0 mls each time.

## 2020-03-26 NOTE — ED Notes (Signed)
Blood Cx 2x obtained before ABX

## 2020-03-29 DIAGNOSIS — N453 Epididymo-orchitis: Secondary | ICD-10-CM | POA: Insufficient documentation

## 2020-03-29 DIAGNOSIS — R972 Elevated prostate specific antigen [PSA]: Secondary | ICD-10-CM | POA: Diagnosis not present

## 2020-03-29 DIAGNOSIS — Z9079 Acquired absence of other genital organ(s): Secondary | ICD-10-CM | POA: Diagnosis not present

## 2020-03-29 LAB — URINE CULTURE: Culture: >=100000 — AB

## 2020-03-31 LAB — CULTURE, BLOOD (ROUTINE X 2)
Culture: NO GROWTH
Culture: NO GROWTH
Special Requests: ADEQUATE
Special Requests: ADEQUATE

## 2020-04-13 ENCOUNTER — Ambulatory Visit (INDEPENDENT_AMBULATORY_CARE_PROVIDER_SITE_OTHER): Payer: BC Managed Care – PPO | Admitting: Dermatology

## 2020-04-13 ENCOUNTER — Other Ambulatory Visit: Payer: Self-pay

## 2020-04-13 ENCOUNTER — Encounter: Payer: Self-pay | Admitting: Dermatology

## 2020-04-13 DIAGNOSIS — Z1283 Encounter for screening for malignant neoplasm of skin: Secondary | ICD-10-CM

## 2020-04-13 DIAGNOSIS — L905 Scar conditions and fibrosis of skin: Secondary | ICD-10-CM

## 2020-04-13 DIAGNOSIS — L299 Pruritus, unspecified: Secondary | ICD-10-CM | POA: Diagnosis not present

## 2020-04-13 DIAGNOSIS — D1801 Hemangioma of skin and subcutaneous tissue: Secondary | ICD-10-CM

## 2020-04-13 DIAGNOSIS — R21 Rash and other nonspecific skin eruption: Secondary | ICD-10-CM | POA: Diagnosis not present

## 2020-04-13 DIAGNOSIS — Z86018 Personal history of other benign neoplasm: Secondary | ICD-10-CM

## 2020-04-13 MED ORDER — TRIAMCINOLONE ACETONIDE 0.1 % EX CREA: 1.0000 "application " | TOPICAL_CREAM | Freq: Two times a day (BID) | CUTANEOUS | 2 refills | Status: DC | PRN

## 2020-04-13 NOTE — Patient Instructions (Addendum)
Loratadine  Antihistamine take 1 in the am and 1 in the pm  Follow up in 10 days to see how cream is working.

## 2020-04-19 ENCOUNTER — Encounter: Payer: Self-pay | Admitting: Dermatology

## 2020-04-24 ENCOUNTER — Encounter: Payer: Self-pay | Admitting: Dermatology

## 2020-04-24 NOTE — Progress Notes (Signed)
   Follow-Up Visit   Subjective  Garrett Keller is a 60 y.o. male who presents for the following: Annual Exam and Rash (All over body before x months- now legs are flared up- wondering if he has chlorine allergy to pool - + ithc tx -none).  Itching Location:  Duration:  Quality:  Associated Signs/Symptoms: Modifying Factors:  Severity:  Timing: After swimming and pool Context: Would also like general skin check  Objective  Well appearing patient in no apparent distress; mood and affect are within normal limits. Objective  Left Breast: Full body skin-no atypical moles, no melanomas   Patient stated he was recovering from uti-Dr. Rosana Hoes.  Objective  Left Upper Back: multiple 1 to 2 mm smooth red papules  Objective  Left Lower Leg - Anterior, Right Lower Leg - Anterior: Normalized itching, usually worst on legs, almost always 1000-minute, improved.  Denies itching with showering.  Onset typically minutes after swimming and lasts hours.  Temperature of water does not seem to be a factor.  Did have routine laboratory studies by PCP which were reportedly normal.  He notices hives or feel short of breath or dizzy.  Objective  Left Upper Back: History of multiple dysplastic moles.  No recurrent pigmentation.   A full examination was performed including scalp, head, eyes, ears, nose, lips, neck, chest, axillae, abdomen, back, buttocks, bilateral upper extremities, bilateral lower extremities, hands, feet, fingers, toes, fingernails, and toenails. All findings within normal limits unless otherwise noted below.   Assessment & Plan    Screening exam for skin cancer Left Breast  Yearly skin check   Cherry angioma Left Upper Back  No intervention necessary  Rash and other nonspecific skin eruption  Pruritus (2) Left Lower Leg - Anterior; Right Lower Leg - Anterior  I told Mr. Feltus quite frankly I do not recall why there reading or seeing similar water induced itching.  We  discussed aqua genic pruritus and he certainly may try twice daily loratadine 10 mg to see if this will minimize symptoms.  Likewise he can use triamcinolone cream as soon as possible on areas that itch, but I do not know if this will prove to be helpful.  Initial follow-up by phone in 2 weeks.  Ordered Medications: triamcinolone (KENALOG) 0.1 %  Hx of dysplastic nevus Left Upper Back  Annual skin examination.  Encouraged to self examine twice annually.     I, Lavonna Monarch, MD, have reviewed all documentation for this visit.  The documentation on 04/24/20 for the exam, diagnosis, procedures, and orders are all accurate and complete.

## 2020-06-08 DIAGNOSIS — R972 Elevated prostate specific antigen [PSA]: Secondary | ICD-10-CM | POA: Diagnosis not present

## 2020-06-08 DIAGNOSIS — R21 Rash and other nonspecific skin eruption: Secondary | ICD-10-CM | POA: Diagnosis not present

## 2020-06-08 DIAGNOSIS — C884 Extranodal marginal zone B-cell lymphoma of mucosa-associated lymphoid tissue [MALT-lymphoma]: Secondary | ICD-10-CM | POA: Diagnosis not present

## 2020-06-23 DIAGNOSIS — Z87438 Personal history of other diseases of male genital organs: Secondary | ICD-10-CM | POA: Diagnosis not present

## 2020-06-23 DIAGNOSIS — R972 Elevated prostate specific antigen [PSA]: Secondary | ICD-10-CM | POA: Diagnosis not present

## 2020-06-28 ENCOUNTER — Other Ambulatory Visit: Payer: Self-pay | Admitting: Cardiovascular Disease

## 2020-07-15 DIAGNOSIS — Z5181 Encounter for therapeutic drug level monitoring: Secondary | ICD-10-CM | POA: Diagnosis not present

## 2020-07-15 DIAGNOSIS — K219 Gastro-esophageal reflux disease without esophagitis: Secondary | ICD-10-CM | POA: Diagnosis not present

## 2020-07-15 DIAGNOSIS — Z0000 Encounter for general adult medical examination without abnormal findings: Secondary | ICD-10-CM | POA: Diagnosis not present

## 2020-07-15 DIAGNOSIS — G4700 Insomnia, unspecified: Secondary | ICD-10-CM | POA: Diagnosis not present

## 2020-07-15 DIAGNOSIS — E669 Obesity, unspecified: Secondary | ICD-10-CM | POA: Diagnosis not present

## 2020-07-15 DIAGNOSIS — E785 Hyperlipidemia, unspecified: Secondary | ICD-10-CM | POA: Diagnosis not present

## 2020-08-30 DIAGNOSIS — M9901 Segmental and somatic dysfunction of cervical region: Secondary | ICD-10-CM | POA: Diagnosis not present

## 2020-08-30 DIAGNOSIS — M9905 Segmental and somatic dysfunction of pelvic region: Secondary | ICD-10-CM | POA: Diagnosis not present

## 2020-08-30 DIAGNOSIS — M9903 Segmental and somatic dysfunction of lumbar region: Secondary | ICD-10-CM | POA: Diagnosis not present

## 2020-08-30 DIAGNOSIS — M9904 Segmental and somatic dysfunction of sacral region: Secondary | ICD-10-CM | POA: Diagnosis not present

## 2020-08-31 DIAGNOSIS — M9905 Segmental and somatic dysfunction of pelvic region: Secondary | ICD-10-CM | POA: Diagnosis not present

## 2020-08-31 DIAGNOSIS — L905 Scar conditions and fibrosis of skin: Secondary | ICD-10-CM | POA: Diagnosis not present

## 2020-08-31 DIAGNOSIS — Z85828 Personal history of other malignant neoplasm of skin: Secondary | ICD-10-CM | POA: Diagnosis not present

## 2020-08-31 DIAGNOSIS — L821 Other seborrheic keratosis: Secondary | ICD-10-CM | POA: Diagnosis not present

## 2020-08-31 DIAGNOSIS — M9904 Segmental and somatic dysfunction of sacral region: Secondary | ICD-10-CM | POA: Diagnosis not present

## 2020-08-31 DIAGNOSIS — M9901 Segmental and somatic dysfunction of cervical region: Secondary | ICD-10-CM | POA: Diagnosis not present

## 2020-08-31 DIAGNOSIS — D1801 Hemangioma of skin and subcutaneous tissue: Secondary | ICD-10-CM | POA: Diagnosis not present

## 2020-08-31 DIAGNOSIS — M9903 Segmental and somatic dysfunction of lumbar region: Secondary | ICD-10-CM | POA: Diagnosis not present

## 2020-09-01 DIAGNOSIS — M9905 Segmental and somatic dysfunction of pelvic region: Secondary | ICD-10-CM | POA: Diagnosis not present

## 2020-09-01 DIAGNOSIS — M9903 Segmental and somatic dysfunction of lumbar region: Secondary | ICD-10-CM | POA: Diagnosis not present

## 2020-09-01 DIAGNOSIS — M9901 Segmental and somatic dysfunction of cervical region: Secondary | ICD-10-CM | POA: Diagnosis not present

## 2020-09-01 DIAGNOSIS — M9904 Segmental and somatic dysfunction of sacral region: Secondary | ICD-10-CM | POA: Diagnosis not present

## 2020-09-02 DIAGNOSIS — M9905 Segmental and somatic dysfunction of pelvic region: Secondary | ICD-10-CM | POA: Diagnosis not present

## 2020-09-02 DIAGNOSIS — M9904 Segmental and somatic dysfunction of sacral region: Secondary | ICD-10-CM | POA: Diagnosis not present

## 2020-09-02 DIAGNOSIS — M9901 Segmental and somatic dysfunction of cervical region: Secondary | ICD-10-CM | POA: Diagnosis not present

## 2020-09-02 DIAGNOSIS — M9903 Segmental and somatic dysfunction of lumbar region: Secondary | ICD-10-CM | POA: Diagnosis not present

## 2020-09-05 DIAGNOSIS — M9905 Segmental and somatic dysfunction of pelvic region: Secondary | ICD-10-CM | POA: Diagnosis not present

## 2020-09-05 DIAGNOSIS — M9903 Segmental and somatic dysfunction of lumbar region: Secondary | ICD-10-CM | POA: Diagnosis not present

## 2020-09-05 DIAGNOSIS — M9901 Segmental and somatic dysfunction of cervical region: Secondary | ICD-10-CM | POA: Diagnosis not present

## 2020-09-05 DIAGNOSIS — M9904 Segmental and somatic dysfunction of sacral region: Secondary | ICD-10-CM | POA: Diagnosis not present

## 2020-09-07 DIAGNOSIS — M9903 Segmental and somatic dysfunction of lumbar region: Secondary | ICD-10-CM | POA: Diagnosis not present

## 2020-09-07 DIAGNOSIS — M9904 Segmental and somatic dysfunction of sacral region: Secondary | ICD-10-CM | POA: Diagnosis not present

## 2020-09-07 DIAGNOSIS — M9905 Segmental and somatic dysfunction of pelvic region: Secondary | ICD-10-CM | POA: Diagnosis not present

## 2020-09-07 DIAGNOSIS — M9901 Segmental and somatic dysfunction of cervical region: Secondary | ICD-10-CM | POA: Diagnosis not present

## 2020-09-09 DIAGNOSIS — M9904 Segmental and somatic dysfunction of sacral region: Secondary | ICD-10-CM | POA: Diagnosis not present

## 2020-09-09 DIAGNOSIS — M9901 Segmental and somatic dysfunction of cervical region: Secondary | ICD-10-CM | POA: Diagnosis not present

## 2020-09-09 DIAGNOSIS — M9905 Segmental and somatic dysfunction of pelvic region: Secondary | ICD-10-CM | POA: Diagnosis not present

## 2020-09-09 DIAGNOSIS — M9903 Segmental and somatic dysfunction of lumbar region: Secondary | ICD-10-CM | POA: Diagnosis not present

## 2020-09-12 DIAGNOSIS — M9903 Segmental and somatic dysfunction of lumbar region: Secondary | ICD-10-CM | POA: Diagnosis not present

## 2020-09-12 DIAGNOSIS — M9901 Segmental and somatic dysfunction of cervical region: Secondary | ICD-10-CM | POA: Diagnosis not present

## 2020-09-12 DIAGNOSIS — M9905 Segmental and somatic dysfunction of pelvic region: Secondary | ICD-10-CM | POA: Diagnosis not present

## 2020-09-12 DIAGNOSIS — M9904 Segmental and somatic dysfunction of sacral region: Secondary | ICD-10-CM | POA: Diagnosis not present

## 2020-09-13 DIAGNOSIS — M9904 Segmental and somatic dysfunction of sacral region: Secondary | ICD-10-CM | POA: Diagnosis not present

## 2020-09-13 DIAGNOSIS — M9901 Segmental and somatic dysfunction of cervical region: Secondary | ICD-10-CM | POA: Diagnosis not present

## 2020-09-13 DIAGNOSIS — M9903 Segmental and somatic dysfunction of lumbar region: Secondary | ICD-10-CM | POA: Diagnosis not present

## 2020-09-13 DIAGNOSIS — M9905 Segmental and somatic dysfunction of pelvic region: Secondary | ICD-10-CM | POA: Diagnosis not present

## 2020-09-14 DIAGNOSIS — M9905 Segmental and somatic dysfunction of pelvic region: Secondary | ICD-10-CM | POA: Diagnosis not present

## 2020-09-14 DIAGNOSIS — M9903 Segmental and somatic dysfunction of lumbar region: Secondary | ICD-10-CM | POA: Diagnosis not present

## 2020-09-14 DIAGNOSIS — M9904 Segmental and somatic dysfunction of sacral region: Secondary | ICD-10-CM | POA: Diagnosis not present

## 2020-09-14 DIAGNOSIS — M9901 Segmental and somatic dysfunction of cervical region: Secondary | ICD-10-CM | POA: Diagnosis not present

## 2020-09-19 DIAGNOSIS — M9905 Segmental and somatic dysfunction of pelvic region: Secondary | ICD-10-CM | POA: Diagnosis not present

## 2020-09-19 DIAGNOSIS — M9903 Segmental and somatic dysfunction of lumbar region: Secondary | ICD-10-CM | POA: Diagnosis not present

## 2020-09-19 DIAGNOSIS — M9904 Segmental and somatic dysfunction of sacral region: Secondary | ICD-10-CM | POA: Diagnosis not present

## 2020-09-19 DIAGNOSIS — M9901 Segmental and somatic dysfunction of cervical region: Secondary | ICD-10-CM | POA: Diagnosis not present

## 2020-09-20 DIAGNOSIS — M9905 Segmental and somatic dysfunction of pelvic region: Secondary | ICD-10-CM | POA: Diagnosis not present

## 2020-09-20 DIAGNOSIS — M9904 Segmental and somatic dysfunction of sacral region: Secondary | ICD-10-CM | POA: Diagnosis not present

## 2020-09-20 DIAGNOSIS — M9901 Segmental and somatic dysfunction of cervical region: Secondary | ICD-10-CM | POA: Diagnosis not present

## 2020-09-20 DIAGNOSIS — M9903 Segmental and somatic dysfunction of lumbar region: Secondary | ICD-10-CM | POA: Diagnosis not present

## 2020-09-23 DIAGNOSIS — M9904 Segmental and somatic dysfunction of sacral region: Secondary | ICD-10-CM | POA: Diagnosis not present

## 2020-09-23 DIAGNOSIS — M9905 Segmental and somatic dysfunction of pelvic region: Secondary | ICD-10-CM | POA: Diagnosis not present

## 2020-09-23 DIAGNOSIS — M9901 Segmental and somatic dysfunction of cervical region: Secondary | ICD-10-CM | POA: Diagnosis not present

## 2020-09-23 DIAGNOSIS — M9903 Segmental and somatic dysfunction of lumbar region: Secondary | ICD-10-CM | POA: Diagnosis not present

## 2020-10-13 ENCOUNTER — Ambulatory Visit: Payer: BC Managed Care – PPO | Attending: Internal Medicine

## 2020-10-13 DIAGNOSIS — Z23 Encounter for immunization: Secondary | ICD-10-CM

## 2020-10-13 NOTE — Progress Notes (Signed)
   Covid-19 Vaccination Clinic  Name:  Garrett Keller    MRN: [id number] DOB: 1960-02-16  10/13/2020  [name] was observed post Covid-19 immunization for 15 minutes without incident. He was provided with Vaccine Information Sheet and instruction to access the V-Safe system.   [name] was instructed to call 911 with any severe reactions post vaccine: Difficulty breathing  Swelling of face and throat  A fast heartbeat  A bad rash all over body  Dizziness and weakness

## 2020-10-19 ENCOUNTER — Other Ambulatory Visit (HOSPITAL_BASED_OUTPATIENT_CLINIC_OR_DEPARTMENT_OTHER): Payer: Self-pay

## 2020-10-19 MED ORDER — COVID-19MRNA BIVAL VACC PFIZER 30 MCG/0.3ML IM SUSP
INTRAMUSCULAR | 0 refills | Status: AC
  Filled 2020-10-19: qty 0.3, 1d supply, fill #0

## 2020-11-22 ENCOUNTER — Encounter: Payer: Self-pay | Admitting: Registered Nurse

## 2020-11-22 ENCOUNTER — Other Ambulatory Visit: Payer: Self-pay

## 2020-11-22 ENCOUNTER — Ambulatory Visit: Payer: Self-pay | Admitting: Registered Nurse

## 2020-11-22 NOTE — Progress Notes (Signed)
Erroneous encounter. Disregard.

## 2020-12-07 DIAGNOSIS — C884 Extranodal marginal zone B-cell lymphoma of mucosa-associated lymphoid tissue [MALT-lymphoma]: Secondary | ICD-10-CM | POA: Diagnosis not present

## 2021-01-10 ENCOUNTER — Other Ambulatory Visit: Payer: Self-pay | Admitting: Cardiovascular Disease

## 2021-04-17 ENCOUNTER — Ambulatory Visit: Payer: BC Managed Care – PPO | Admitting: Dermatology

## 2021-05-09 DIAGNOSIS — R351 Nocturia: Secondary | ICD-10-CM | POA: Diagnosis not present

## 2021-05-09 DIAGNOSIS — M546 Pain in thoracic spine: Secondary | ICD-10-CM | POA: Diagnosis not present

## 2021-06-07 DIAGNOSIS — D696 Thrombocytopenia, unspecified: Secondary | ICD-10-CM | POA: Diagnosis not present

## 2021-06-07 DIAGNOSIS — R350 Frequency of micturition: Secondary | ICD-10-CM | POA: Diagnosis not present

## 2021-06-07 DIAGNOSIS — C884 Extranodal marginal zone B-cell lymphoma of mucosa-associated lymphoid tissue [MALT-lymphoma]: Secondary | ICD-10-CM | POA: Diagnosis not present

## 2021-06-23 DIAGNOSIS — N401 Enlarged prostate with lower urinary tract symptoms: Secondary | ICD-10-CM | POA: Diagnosis not present

## 2021-06-23 DIAGNOSIS — N138 Other obstructive and reflux uropathy: Secondary | ICD-10-CM | POA: Diagnosis not present

## 2021-06-23 DIAGNOSIS — R972 Elevated prostate specific antigen [PSA]: Secondary | ICD-10-CM | POA: Diagnosis not present

## 2021-07-16 ENCOUNTER — Other Ambulatory Visit: Payer: Self-pay | Admitting: Cardiovascular Disease

## 2021-08-01 DIAGNOSIS — I700 Atherosclerosis of aorta: Secondary | ICD-10-CM | POA: Diagnosis not present

## 2021-08-01 DIAGNOSIS — C884 Extranodal marginal zone B-cell lymphoma of mucosa-associated lymphoid tissue [MALT-lymphoma]: Secondary | ICD-10-CM | POA: Diagnosis not present

## 2021-08-01 DIAGNOSIS — Z0000 Encounter for general adult medical examination without abnormal findings: Secondary | ICD-10-CM | POA: Diagnosis not present

## 2021-08-01 DIAGNOSIS — Z23 Encounter for immunization: Secondary | ICD-10-CM | POA: Diagnosis not present

## 2021-08-11 ENCOUNTER — Other Ambulatory Visit (HOSPITAL_BASED_OUTPATIENT_CLINIC_OR_DEPARTMENT_OTHER): Payer: Self-pay

## 2021-08-11 NOTE — Telephone Encounter (Signed)
Received fax from [facility] requesting refills for Rosuvastatin. Pt has not been seen since 01/2020. Pt of Dr. Oval Linsey.  Routing to scheduling team to schedule overdue appointment for refills.

## 2021-08-15 ENCOUNTER — Other Ambulatory Visit: Payer: Self-pay | Admitting: Cardiovascular Disease

## 2021-08-15 NOTE — Telephone Encounter (Signed)
*  STAT* If patient is at the pharmacy, call can be transferred to refill team.   1. Which medications need to be refilled? (please list name of each medication and dose if known)   rosuvastatin (CRESTOR) 20 MG tablet  2. Which pharmacy/location (including street and city if local pharmacy) is medication to be sent to?  [id number] [place] [place]. Do they need a 30 day or 90 day supply?   90 day    Patient has appointment on 8/17.

## 2021-08-16 MED ORDER — ROSUVASTATIN CALCIUM 20 MG PO TABS: 20.0000 mg | ORAL_TABLET | Freq: Every day | ORAL | 0 refills | Status: DC

## 2021-08-16 NOTE — Telephone Encounter (Signed)
Rx request sent to pharmacy.  

## 2021-09-07 ENCOUNTER — Ambulatory Visit (INDEPENDENT_AMBULATORY_CARE_PROVIDER_SITE_OTHER): Payer: BC Managed Care – PPO | Admitting: Family

## 2021-09-07 ENCOUNTER — Encounter (HOSPITAL_BASED_OUTPATIENT_CLINIC_OR_DEPARTMENT_OTHER): Payer: Self-pay | Admitting: Family

## 2021-09-07 VITALS — BP 110/80 | HR 60 | Ht 70.0 in | Wt 240.3 lb

## 2021-09-07 DIAGNOSIS — I2510 Atherosclerotic heart disease of native coronary artery without angina pectoris: Secondary | ICD-10-CM

## 2021-09-07 DIAGNOSIS — E785 Hyperlipidemia, unspecified: Secondary | ICD-10-CM

## 2021-09-07 DIAGNOSIS — I700 Atherosclerosis of aorta: Secondary | ICD-10-CM | POA: Diagnosis not present

## 2021-09-07 MED ORDER — ROSUVASTATIN CALCIUM 40 MG PO TABS: 40.0000 mg | ORAL_TABLET | Freq: Every day | ORAL | 3 refills | Status: DC

## 2021-09-07 NOTE — Progress Notes (Signed)
Office Visit    Patient Name: Garrett Keller Date of Encounter: 09/07/2021  PCP:  Kathyrn Lass, MD   [facility]  Cardiologist:  Skeet Latch, MD  Advanced Practice Provider:  No care team member to display Electrophysiologist:  None      Chief Complaint    Garrett Keller is a 61 y.o. male presents today for CAD follow up.    Past Medical History    Past Medical History:  Diagnosis Date   Arthritis    Atypical mole 10/15/2019   mild-left upper arm   Coronary artery calcification seen on CAT scan 01/02/2018   Exertional dyspnea 01/02/2018   Pure hypercholesterolemia 01/02/2018   Past Surgical History:  Procedure Laterality Date   CHOLECYSTECTOMY  2008   [place] gland  2003   large gland inside of cheek    Allergies  Allergies  Allergen Reactions   Adhesive [Tape]     Patient does well with paper tape   Atorvastatin Diarrhea    History of Present Illness    Garrett Keller is a 61 y.o. male with a hx of coronary artery calcification on CT, hyperlipidemia, prior mild lymphoma, GERD last seen 02/15/20.  Initially seen 2019 for shortness of breath.  Prior mild lymphoma.  Underwent resection of right parotid in 2003.  2017 had mass removed from his left neck and several lymph nodes removed.  No chemotherapy or radiation.  Chest CT noted atherosclerosis of the aorta and LAD.  ETT 01/15/2018 negative for ischemia.  Achieved 13 METS on Bruce protocol.  He is followed annually with Dr. Oval Linsey and has done well from a cardiac perspective.  Last seen 02/15/2020.  Doing well since last seen. Following heart healthy diet. Exercising by swimming a the [place] and walking in his neighborhood. Reports no shortness of breath nor dyspnea on exertion. Reports no chest pain, pressure, or tightness. No edema, orthopnea, PND. Reports no palpitations.    EKGs/Labs/Other Studies Reviewed:   The following studies were reviewed today: ETT January 15, 2018 Clinically  and electrically negative for ischemia Excellent exercise tolerance  EKG:  EKG is ordered today.  The ekg ordered today demonstrates NSR 60 bpm with no acute ST/T wave changes.   Recent Labs: No results found for requested labs within last 365 days.  Recent Lipid Panel    Component Value Date/Time   CHOL 154 03/21/2018 0000   TRIG 97 03/21/2018 0000   HDL 39 (L) 03/21/2018 0000   CHOLHDL 3.9 03/21/2018 0000   LDLCALC 96 03/21/2018 0000    Home Medications   Current Meds  Medication Sig   aspirin 81 MG EC tablet Take by mouth. Take 2   Multiple Vitamins-Minerals (MULTIVITAMIN MEN 50+ PO) Take 1 tablet by mouth daily.   rosuvastatin (CRESTOR) 40 MG tablet Take 1 tablet (40 mg total) by mouth daily.     Review of Systems      All other systems reviewed and are otherwise negative except as noted above.  Physical Exam    VS:  BP 110/80   Pulse 60   Ht '5\' 10"'$  (1.778 m)   Wt 240 lb 4.8 oz (109 kg)   BMI 34.48 kg/m  , BMI Body mass index is 34.48 kg/m.  Wt Readings from Last 3 Encounters:  09/07/21 240 lb 4.8 oz (109 kg)  03/26/20 229 lb 1.6 oz (103.9 kg)  02/15/20 240 lb (108.9 kg)     GEN: Well nourished, well developed, in  no acute distress. HEENT: normal. Neck: Supple, no JVD, carotid bruits, or masses. Cardiac: RRR, no murmurs, rubs, or gallops. No clubbing, cyanosis, edema.  Radials/PT 2+ and equal bilaterally.  Respiratory:  Respirations regular and unlabored, clear to auscultation bilaterally. GI: Soft, nontender, nondistended. MS: No deformity or atrophy. Skin: Warm and dry, no rash. Neuro:  Strength and sensation are intact. Psych: Normal affect.  Assessment & Plan    CAD / Aortic atherosclerosis / HLD, LDL goal <70 - Stable with no anginal symptoms. No indication for ischemic evaluation.  GDMT rosuvastatin, aspirin. No BB due to relative bradycardia. Heart healthy diet and regular cardiovascular exercise encouraged.  07/2021 LDL 80 - not at goal of <70.  Increase Rosuvastatin to '40mg'$  QD with CMP, lipid panel in 2-3 months.          Disposition: Follow up in 1 year(s) with Skeet Latch, MD or APP.  Signed, Loel Dubonnet, NP 09/07/2021, 1:15 PM [facility]

## 2021-09-07 NOTE — Patient Instructions (Signed)
Medication Instructions:  Your physician has recommended you make the following change in your medication:   Change: Increase Crestor '40mg'$  daily   *If you need a refill on your cardiac medications before your next appointment, please call your pharmacy*   Lab Work: Please return for Lab work in 2-3 months for Fasting Lipid Panel and CMP. You may come to the...   [facility] (3rd floor) [address] [place], [place] [address]  Open: 8am-Noon and 1pm-4:30pm  Please ring the doorbell on the small table when you exit the elevator and the Lab Tech will come get you  [place] [facility] [address] [place], [place], [place] [address] Open: 8am-1pm, then 2pm-4:30pm   [place]- Please see attached locations sheet stapled to your lab work with address and hours.   If you have labs (blood work) drawn today and your tests are completely normal, you will receive your results only by: [place] (if you have MyChart) OR A paper copy in the mail If you have any lab test that is abnormal or we need to change your treatment, we will call you to review the results.   Testing/Procedures: None ordered today    Follow-Up: At [facility], you and your health needs are our priority.  As part of our continuing mission to provide you with exceptional heart care, we have created designated Provider Care Teams.  These Care Teams include your primary Cardiologist (physician) and Advanced Practice Providers (APPs -  Physician Assistants and Nurse Practitioners) who all work together to provide you with the care you need, when you need it.  We recommend signing up for the patient portal called "MyChart".  Sign up information is provided on this After Visit Summary.  MyChart is used to connect with patients for Virtual Visits (Telemedicine).  Patients are able to view lab/test results, encounter notes, upcoming appointments, etc.  Non-urgent messages  can be sent to your provider as well.   To learn more about what you can do with MyChart, go to [email or web address].    Your next appointment:   1 year(s)  The format for your next appointment:   In Person  Provider:   Skeet Latch, MD or Laurann Montana, NP{  Other Instructions Heart Healthy Diet Recommendations: A low-salt diet is recommended. Meats should be grilled, baked, or boiled. Avoid fried foods. Focus on lean protein sources like fish or chicken with vegetables and fruits. The American Heart Association is a Microbiologist!  American Heart Association Diet and Lifeystyle Recommendations   Exercise recommendations: The American Heart Association recommends 150 minutes of moderate intensity exercise weekly. Try 30 minutes of moderate intensity exercise 4-5 times per week. This could include walking, jogging, or swimming.   Important Information About Sugar

## 2021-09-11 ENCOUNTER — Telehealth (HOSPITAL_COMMUNITY): Payer: Self-pay | Admitting: *Deleted

## 2021-09-11 DIAGNOSIS — F4310 Post-traumatic stress disorder, unspecified: Secondary | ICD-10-CM | POA: Diagnosis not present

## 2021-09-11 NOTE — Telephone Encounter (Signed)
Returned Call  Patient  Didn't answer phone LVM to Return call

## 2021-09-13 DIAGNOSIS — L309 Dermatitis, unspecified: Secondary | ICD-10-CM | POA: Diagnosis not present

## 2021-09-13 DIAGNOSIS — D225 Melanocytic nevi of trunk: Secondary | ICD-10-CM | POA: Diagnosis not present

## 2021-09-13 DIAGNOSIS — L814 Other melanin hyperpigmentation: Secondary | ICD-10-CM | POA: Diagnosis not present

## 2021-09-13 DIAGNOSIS — L821 Other seborrheic keratosis: Secondary | ICD-10-CM | POA: Diagnosis not present

## 2021-09-28 ENCOUNTER — Encounter (HOSPITAL_BASED_OUTPATIENT_CLINIC_OR_DEPARTMENT_OTHER): Payer: Self-pay

## 2021-10-02 ENCOUNTER — Ambulatory Visit (HOSPITAL_COMMUNITY): Payer: BC Managed Care – PPO | Admitting: Psychiatry

## 2021-10-02 DIAGNOSIS — F4310 Post-traumatic stress disorder, unspecified: Secondary | ICD-10-CM | POA: Diagnosis not present

## 2021-10-02 MED ORDER — HYDROXYZINE HCL 25 MG PO TABS: 25.0000 mg | ORAL_TABLET | Freq: Three times a day (TID) | ORAL | 0 refills | Status: AC | PRN
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
## 2021-11-01 ENCOUNTER — Encounter (HOSPITAL_COMMUNITY): Payer: Self-pay | Admitting: Psychiatry

## 2021-11-01 ENCOUNTER — Ambulatory Visit (HOSPITAL_COMMUNITY): Payer: BC Managed Care – PPO | Admitting: Psychiatry

## 2021-11-01 DIAGNOSIS — F4310 Post-traumatic stress disorder, unspecified: Secondary | ICD-10-CM
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
## 2021-11-15 DIAGNOSIS — F331 Major depressive disorder, recurrent, moderate: Secondary | ICD-10-CM | POA: Diagnosis not present

## 2021-11-29 DIAGNOSIS — F331 Major depressive disorder, recurrent, moderate: Secondary | ICD-10-CM | POA: Diagnosis not present

## 2021-12-04 ENCOUNTER — Ambulatory Visit (HOSPITAL_COMMUNITY): Payer: BC Managed Care – PPO | Admitting: Psychiatry

## 2021-12-06 DIAGNOSIS — Z888 Allergy status to other drugs, medicaments and biological substances status: Secondary | ICD-10-CM | POA: Diagnosis not present

## 2021-12-06 DIAGNOSIS — C884 Extranodal marginal zone B-cell lymphoma of mucosa-associated lymphoid tissue [MALT-lymphoma]: Secondary | ICD-10-CM | POA: Diagnosis not present

## 2021-12-12 DIAGNOSIS — F331 Major depressive disorder, recurrent, moderate: Secondary | ICD-10-CM | POA: Diagnosis not present

## 2021-12-27 DIAGNOSIS — F331 Major depressive disorder, recurrent, moderate: Secondary | ICD-10-CM | POA: Diagnosis not present

## 2021-12-27 DIAGNOSIS — F4312 Post-traumatic stress disorder, chronic: Secondary | ICD-10-CM | POA: Diagnosis not present

## 2022-01-10 DIAGNOSIS — F331 Major depressive disorder, recurrent, moderate: Secondary | ICD-10-CM | POA: Diagnosis not present

## 2022-01-10 DIAGNOSIS — F4310 Post-traumatic stress disorder, unspecified: Secondary | ICD-10-CM | POA: Diagnosis not present

## 2022-02-07 DIAGNOSIS — F331 Major depressive disorder, recurrent, moderate: Secondary | ICD-10-CM | POA: Diagnosis not present

## 2022-02-13 DIAGNOSIS — M25512 Pain in left shoulder: Secondary | ICD-10-CM | POA: Diagnosis not present

## 2022-02-21 DIAGNOSIS — M25512 Pain in left shoulder: Secondary | ICD-10-CM | POA: Diagnosis not present

## 2022-02-23 DIAGNOSIS — M25512 Pain in left shoulder: Secondary | ICD-10-CM | POA: Diagnosis not present

## 2022-03-07 DIAGNOSIS — F331 Major depressive disorder, recurrent, moderate: Secondary | ICD-10-CM | POA: Diagnosis not present

## 2022-03-12 DIAGNOSIS — X58XXXA Exposure to other specified factors, initial encounter: Secondary | ICD-10-CM | POA: Diagnosis not present

## 2022-03-12 DIAGNOSIS — S46011A Strain of muscle(s) and tendon(s) of the rotator cuff of right shoulder, initial encounter: Secondary | ICD-10-CM | POA: Diagnosis not present

## 2022-03-12 DIAGNOSIS — M19012 Primary osteoarthritis, left shoulder: Secondary | ICD-10-CM | POA: Diagnosis not present

## 2022-03-12 DIAGNOSIS — Y999 Unspecified external cause status: Secondary | ICD-10-CM | POA: Diagnosis not present

## 2022-03-12 DIAGNOSIS — M7522 Bicipital tendinitis, left shoulder: Secondary | ICD-10-CM | POA: Diagnosis not present

## 2022-03-12 DIAGNOSIS — M24112 Other articular cartilage disorders, left shoulder: Secondary | ICD-10-CM | POA: Diagnosis not present

## 2022-03-12 DIAGNOSIS — M7552 Bursitis of left shoulder: Secondary | ICD-10-CM | POA: Diagnosis not present

## 2022-03-12 DIAGNOSIS — S46012A Strain of muscle(s) and tendon(s) of the rotator cuff of left shoulder, initial encounter: Secondary | ICD-10-CM | POA: Diagnosis not present

## 2022-03-12 DIAGNOSIS — M7541 Impingement syndrome of right shoulder: Secondary | ICD-10-CM | POA: Diagnosis not present

## 2022-03-12 DIAGNOSIS — S46211A Strain of muscle, fascia and tendon of other parts of biceps, right arm, initial encounter: Secondary | ICD-10-CM | POA: Diagnosis not present

## 2022-03-12 DIAGNOSIS — M19011 Primary osteoarthritis, right shoulder: Secondary | ICD-10-CM | POA: Diagnosis not present

## 2022-03-12 DIAGNOSIS — G8918 Other acute postprocedural pain: Secondary | ICD-10-CM | POA: Diagnosis not present

## 2022-03-15 DIAGNOSIS — S46012D Strain of muscle(s) and tendon(s) of the rotator cuff of left shoulder, subsequent encounter: Secondary | ICD-10-CM | POA: Diagnosis not present

## 2022-03-20 DIAGNOSIS — S46012D Strain of muscle(s) and tendon(s) of the rotator cuff of left shoulder, subsequent encounter: Secondary | ICD-10-CM | POA: Diagnosis not present

## 2022-04-04 ENCOUNTER — Telehealth: Payer: Self-pay | Admitting: Registered Nurse

## 2022-04-04 DIAGNOSIS — F331 Major depressive disorder, recurrent, moderate: Secondary | ICD-10-CM | POA: Diagnosis not present

## 2022-04-04 NOTE — Telephone Encounter (Signed)
Erroneous encounter

## 2022-04-24 DIAGNOSIS — S46012D Strain of muscle(s) and tendon(s) of the rotator cuff of left shoulder, subsequent encounter: Secondary | ICD-10-CM | POA: Diagnosis not present

## 2022-04-26 DIAGNOSIS — S46012D Strain of muscle(s) and tendon(s) of the rotator cuff of left shoulder, subsequent encounter: Secondary | ICD-10-CM | POA: Diagnosis not present

## 2022-04-27 DIAGNOSIS — S46012D Strain of muscle(s) and tendon(s) of the rotator cuff of left shoulder, subsequent encounter: Secondary | ICD-10-CM | POA: Diagnosis not present

## 2022-04-30 DIAGNOSIS — S46012D Strain of muscle(s) and tendon(s) of the rotator cuff of left shoulder, subsequent encounter: Secondary | ICD-10-CM | POA: Diagnosis not present

## 2022-05-02 DIAGNOSIS — S46012D Strain of muscle(s) and tendon(s) of the rotator cuff of left shoulder, subsequent encounter: Secondary | ICD-10-CM | POA: Diagnosis not present

## 2022-05-09 DIAGNOSIS — S46012D Strain of muscle(s) and tendon(s) of the rotator cuff of left shoulder, subsequent encounter: Secondary | ICD-10-CM | POA: Diagnosis not present

## 2022-05-11 DIAGNOSIS — S46012D Strain of muscle(s) and tendon(s) of the rotator cuff of left shoulder, subsequent encounter: Secondary | ICD-10-CM | POA: Diagnosis not present

## 2022-05-16 DIAGNOSIS — S46012D Strain of muscle(s) and tendon(s) of the rotator cuff of left shoulder, subsequent encounter: Secondary | ICD-10-CM | POA: Diagnosis not present

## 2022-05-18 DIAGNOSIS — S46012D Strain of muscle(s) and tendon(s) of the rotator cuff of left shoulder, subsequent encounter: Secondary | ICD-10-CM | POA: Diagnosis not present

## 2022-05-21 DIAGNOSIS — S46012D Strain of muscle(s) and tendon(s) of the rotator cuff of left shoulder, subsequent encounter: Secondary | ICD-10-CM | POA: Diagnosis not present

## 2022-05-23 DIAGNOSIS — S46012D Strain of muscle(s) and tendon(s) of the rotator cuff of left shoulder, subsequent encounter: Secondary | ICD-10-CM | POA: Diagnosis not present

## 2022-05-29 DIAGNOSIS — L820 Inflamed seborrheic keratosis: Secondary | ICD-10-CM | POA: Diagnosis not present

## 2022-05-29 DIAGNOSIS — Z08 Encounter for follow-up examination after completed treatment for malignant neoplasm: Secondary | ICD-10-CM | POA: Diagnosis not present

## 2022-05-29 DIAGNOSIS — Z789 Other specified health status: Secondary | ICD-10-CM | POA: Diagnosis not present

## 2022-05-29 DIAGNOSIS — L821 Other seborrheic keratosis: Secondary | ICD-10-CM | POA: Diagnosis not present

## 2022-05-29 DIAGNOSIS — L814 Other melanin hyperpigmentation: Secondary | ICD-10-CM | POA: Diagnosis not present

## 2022-05-29 DIAGNOSIS — D1801 Hemangioma of skin and subcutaneous tissue: Secondary | ICD-10-CM | POA: Diagnosis not present

## 2022-05-29 DIAGNOSIS — R208 Other disturbances of skin sensation: Secondary | ICD-10-CM | POA: Diagnosis not present

## 2022-05-29 DIAGNOSIS — L538 Other specified erythematous conditions: Secondary | ICD-10-CM | POA: Diagnosis not present

## 2022-05-30 DIAGNOSIS — S46012D Strain of muscle(s) and tendon(s) of the rotator cuff of left shoulder, subsequent encounter: Secondary | ICD-10-CM | POA: Diagnosis not present

## 2022-06-01 DIAGNOSIS — S46012D Strain of muscle(s) and tendon(s) of the rotator cuff of left shoulder, subsequent encounter: Secondary | ICD-10-CM | POA: Diagnosis not present

## 2022-06-04 DIAGNOSIS — S46012D Strain of muscle(s) and tendon(s) of the rotator cuff of left shoulder, subsequent encounter: Secondary | ICD-10-CM | POA: Diagnosis not present

## 2022-06-06 DIAGNOSIS — C884 Extranodal marginal zone B-cell lymphoma of mucosa-associated lymphoid tissue [MALT-lymphoma]: Secondary | ICD-10-CM | POA: Diagnosis not present

## 2022-06-08 DIAGNOSIS — S46012D Strain of muscle(s) and tendon(s) of the rotator cuff of left shoulder, subsequent encounter: Secondary | ICD-10-CM | POA: Diagnosis not present

## 2022-06-13 DIAGNOSIS — S46012D Strain of muscle(s) and tendon(s) of the rotator cuff of left shoulder, subsequent encounter: Secondary | ICD-10-CM | POA: Diagnosis not present

## 2022-06-15 DIAGNOSIS — S46012D Strain of muscle(s) and tendon(s) of the rotator cuff of left shoulder, subsequent encounter: Secondary | ICD-10-CM | POA: Diagnosis not present

## 2022-06-20 DIAGNOSIS — S46012D Strain of muscle(s) and tendon(s) of the rotator cuff of left shoulder, subsequent encounter: Secondary | ICD-10-CM | POA: Diagnosis not present

## 2022-06-21 DIAGNOSIS — S46012D Strain of muscle(s) and tendon(s) of the rotator cuff of left shoulder, subsequent encounter: Secondary | ICD-10-CM | POA: Diagnosis not present

## 2022-06-28 DIAGNOSIS — R972 Elevated prostate specific antigen [PSA]: Secondary | ICD-10-CM | POA: Diagnosis not present

## 2022-07-02 DIAGNOSIS — M25512 Pain in left shoulder: Secondary | ICD-10-CM | POA: Diagnosis not present

## 2022-07-10 DIAGNOSIS — M25512 Pain in left shoulder: Secondary | ICD-10-CM | POA: Diagnosis not present

## 2022-07-13 DIAGNOSIS — G252 Other specified forms of tremor: Secondary | ICD-10-CM | POA: Diagnosis not present

## 2022-08-13 DIAGNOSIS — I700 Atherosclerosis of aorta: Secondary | ICD-10-CM | POA: Diagnosis not present

## 2022-08-13 DIAGNOSIS — Z0000 Encounter for general adult medical examination without abnormal findings: Secondary | ICD-10-CM | POA: Diagnosis not present

## 2022-08-13 DIAGNOSIS — G252 Other specified forms of tremor: Secondary | ICD-10-CM | POA: Diagnosis not present

## 2022-08-13 DIAGNOSIS — R972 Elevated prostate specific antigen [PSA]: Secondary | ICD-10-CM | POA: Diagnosis not present

## 2022-08-13 DIAGNOSIS — I2510 Atherosclerotic heart disease of native coronary artery without angina pectoris: Secondary | ICD-10-CM | POA: Diagnosis not present

## 2022-08-17 DIAGNOSIS — C884 Extranodal marginal zone B-cell lymphoma of mucosa-associated lymphoid tissue [MALT-lymphoma]: Secondary | ICD-10-CM | POA: Diagnosis not present

## 2022-08-17 DIAGNOSIS — R251 Tremor, unspecified: Secondary | ICD-10-CM | POA: Diagnosis not present

## 2022-08-25 ENCOUNTER — Other Ambulatory Visit (HOSPITAL_BASED_OUTPATIENT_CLINIC_OR_DEPARTMENT_OTHER): Payer: Self-pay | Admitting: Family

## 2022-08-25 DIAGNOSIS — I2510 Atherosclerotic heart disease of native coronary artery without angina pectoris: Secondary | ICD-10-CM

## 2022-08-25 DIAGNOSIS — E785 Hyperlipidemia, unspecified: Secondary | ICD-10-CM

## 2022-08-25 DIAGNOSIS — I700 Atherosclerosis of aorta: Secondary | ICD-10-CM

## 2022-09-09 DIAGNOSIS — R251 Tremor, unspecified: Secondary | ICD-10-CM | POA: Diagnosis not present

## 2022-09-09 DIAGNOSIS — G379 Demyelinating disease of central nervous system, unspecified: Secondary | ICD-10-CM | POA: Diagnosis not present

## 2022-10-10 DIAGNOSIS — R251 Tremor, unspecified: Secondary | ICD-10-CM | POA: Diagnosis not present

## 2022-10-10 DIAGNOSIS — C884 Extranodal marginal zone B-cell lymphoma of mucosa-associated lymphoid tissue [MALT-lymphoma]: Secondary | ICD-10-CM | POA: Diagnosis not present

## 2022-10-11 ENCOUNTER — Ambulatory Visit: Payer: BC Managed Care – PPO | Admitting: Neurology

## 2022-11-21 ENCOUNTER — Other Ambulatory Visit (HOSPITAL_BASED_OUTPATIENT_CLINIC_OR_DEPARTMENT_OTHER): Payer: Self-pay | Admitting: Family

## 2022-11-21 DIAGNOSIS — I700 Atherosclerosis of aorta: Secondary | ICD-10-CM

## 2022-11-21 DIAGNOSIS — E785 Hyperlipidemia, unspecified: Secondary | ICD-10-CM

## 2022-11-21 DIAGNOSIS — I2510 Atherosclerotic heart disease of native coronary artery without angina pectoris: Secondary | ICD-10-CM

## 2022-11-26 ENCOUNTER — Telehealth: Payer: Self-pay | Admitting: Cardiovascular Disease

## 2022-11-26 ENCOUNTER — Ambulatory Visit (HOSPITAL_BASED_OUTPATIENT_CLINIC_OR_DEPARTMENT_OTHER): Payer: BC Managed Care – PPO | Admitting: Family

## 2022-11-26 ENCOUNTER — Other Ambulatory Visit (HOSPITAL_BASED_OUTPATIENT_CLINIC_OR_DEPARTMENT_OTHER): Payer: BC Managed Care – PPO

## 2022-11-26 ENCOUNTER — Encounter (HOSPITAL_BASED_OUTPATIENT_CLINIC_OR_DEPARTMENT_OTHER): Payer: Self-pay | Admitting: Family

## 2022-11-26 VITALS — BP 136/84 | HR 76 | Ht 70.0 in | Wt 248.3 lb

## 2022-11-26 DIAGNOSIS — R002 Palpitations: Secondary | ICD-10-CM

## 2022-11-26 DIAGNOSIS — E785 Hyperlipidemia, unspecified: Secondary | ICD-10-CM | POA: Diagnosis not present

## 2022-11-26 DIAGNOSIS — I700 Atherosclerosis of aorta: Secondary | ICD-10-CM

## 2022-11-26 DIAGNOSIS — I2510 Atherosclerotic heart disease of native coronary artery without angina pectoris: Secondary | ICD-10-CM

## 2022-11-26 NOTE — Progress Notes (Signed)
Cardiology Office Note:  .   Date:  11/26/2022  ID:  Garrett Keller, DOB 22-Oct-1960, MRN [id number] PCP: Sigmund Hazel, MD  [facility] Providers Cardiologist:  Chilton Si, MD    History of Present Illness: .   Garrett Keller is a 62 y.o. male  with a hx of coronary artery calcification on CT, hyperlipidemia, prior mild lymphoma, GERD.   Initially seen 2019 for shortness of breath.  Prior mild lymphoma.  Underwent resection of right parotid in 2003.  2017 had mass removed from his left neck and several lymph nodes removed.  No chemotherapy or radiation.  Chest CT noted atherosclerosis of the aorta and LAD.  ETT 12/2017 negative for ischemia.  Achieved 13 METS on Bruce protocol.   Last seen 09/07/21 doing well from cardiac perspective with no changes made at that time.  Presents today for follow up after contacting the office noting palpitations.  Initial episode last week was second episode yesterday.  Initial episode while walking and yesterday while sitting still. Felt lightheaded when his heart "hiccupped" . Episodes last seconds.  No chest pain, even, orthopnea, PND.  Does note high caffeine intake with soda, Celcius. No recent stressors, etoh use, no OTC proarrhythmic agents. Has been taking 3 of his aspirin 81mg  as was concerned needed higher dose due to his weight, reassured that 81 mg is sufficient.  ROS: Please see the history of present illness.    All other systems reviewed and are negative.   Studies Reviewed: Marland Kitchen   EKG Interpretation Date/Time:  Monday November 26 2022 15:03:10 EST Ventricular Rate:  67 PR Interval:  160 QRS Duration:  82 QT Interval:  392 QTC Calculation: 414 R Axis:   30  Text Interpretation: Normal sinus rhythm Normal ECG  No acute ST/T wave changes. Confirmed by Gillian Shields ([id number]) on 11/26/2022 3:09:59 PM    Cardiac Studies & Procedures     STRESS TESTS  EXERCISE TOLERANCE TEST (ETT) 01/10/2018  Interpretation Summary   Clinically and electrically negative for ischemia  Excellent exercise tolerance              Risk Assessment/Calculations:             Physical Exam:   VS:  BP 136/84   Pulse 76   Ht 5\' 10"  (1.778 m)   Wt 248 lb 4.8 oz (112.6 kg)   SpO2 98%   BMI 35.63 kg/m    Wt Readings from Last 3 Encounters:  11/26/22 248 lb 4.8 oz (112.6 kg)  09/07/21 240 lb 4.8 oz (109 kg)  03/26/20 229 lb 1.6 oz (103.9 kg)    GEN: Well nourished, well developed in no acute distress NECK: No JVD; No carotid bruits CARDIAC: RRR, no murmurs, rubs, gallops RESPIRATORY:  Clear to auscultation without rales, wheezing or rhonchi  ABDOMEN: Soft, non-tender, non-distended EXTREMITIES:  No edema; No deformity   ASSESSMENT AND PLAN: .    Palpitations - 2 episodes of palpitations lasting seconds described as heart "hiccups". EKG today NSR. Consider etiology PVC or SVT. ZIO placed in clinic. Labs today BMP, mag, TSH, CBC. Encouraged to reduce caffeine intake (high intake of soda, Celcius).   CAD / Aortic atherosclerosis / HLD, LDL goal <70 - Stable with no anginal symptoms. No indication for ischemic evaluation.  GDMT rosuvastatin, aspirin. No BB due to relative bradycardia. Of note, previous fatigue when on Propranolol for tremor.  Heart healthy diet and regular cardiovascular exercise encouraged.  Update CMP, direct LDL.  Dispo: follow up in 6 weeks  Signed, Alver Sorrow, NP

## 2022-11-26 NOTE — Patient Instructions (Addendum)
Medication Instructions:  Your physician recommends that you continue on your current medications as directed. Please refer to the Current Medication list given to you today.  *If you need a refill on your cardiac medications before your next appointment, please call your pharmacy*   Lab Work: Labs today- Direct LDL, CMP, CBC, Mag, and TSH    Testing/Procedures: Your physician has recommended that you wear a Zio monitor.   This monitor is a medical device that records the heart's electrical activity. Doctors most often use these monitors to diagnose arrhythmias. Arrhythmias are problems with the speed or rhythm of the heartbeat. The monitor is a small device applied to your chest. You can wear one while you do your normal daily activities. While wearing this monitor if you have any symptoms to push the button and record what you felt. Once you have worn this monitor for the period of time provider prescribed (Usually 14 days), you will return the monitor device in the postage paid box. Once it is returned they will download the data collected and provide [place] with a report which the provider will then review and we will call you with those results. Important tips:  Avoid showering during the first 24 hours of wearing the monitor. Avoid excessive sweating to help maximize wear time. Do not submerge the device, no hot tubs, and no swimming pools. Keep any lotions or oils away from the patch. After 24 hours you may shower with the patch on. Take brief showers with your back facing the shower head.  Do not remove patch once it has been placed because that will interrupt data and decrease adhesive wear time. Push the button when you have any symptoms and write down what you were feeling. Once you have completed wearing your monitor, remove and place into box which has postage paid and place in your outgoing mailbox.  If for some reason you have misplaced your box then call our office and we can  provide another box and/or mail it off for you.   Follow-Up: At [facility], you and your health needs are our priority.  As part of our continuing mission to provide you with exceptional heart care, we have created designated Provider Care Teams.  These Care Teams include your primary Cardiologist (physician) and Advanced Practice Providers (APPs -  Physician Assistants and Nurse Practitioners) who all work together to provide you with the care you need, when you need it.  We recommend signing up for the patient portal called "MyChart".  Sign up information is provided on this After Visit Summary.  MyChart is used to connect with patients for Virtual Visits (Telemedicine).  Patients are able to view lab/test results, encounter notes, upcoming appointments, etc.  Non-urgent messages can be sent to your provider as well.   To learn more about what you can do with MyChart, go to [email or web address].    Your next appointment:   6 weeks with Dr. Duke Salvia or Gillian Shields, NP   Other Instructions To prevent palpitations: Make sure you are adequately hydrated.  Avoid and/or limit caffeine containing beverages like soda or tea. Exercise regularly.  Manage stress well. Some over the counter medications can cause palpitations such as Benadryl, AdvilPM, TylenolPM. Regular Advil or Tylenol do not cause palpitations.

## 2022-11-26 NOTE — Telephone Encounter (Signed)
Patient c/o Palpitations:  High priority if patient c/o lightheadedness, shortness of breath, or chest pain  How long have you had palpitations/irregular HR/ Afib? Are you having the symptoms now? Last week and no   Are you currently experiencing lightheadedness, SOB or CP? No   Do you have a history of afib (atrial fibrillation) or irregular heart rhythm? No   Have you checked your BP or HR? (document readings if available): No   Are you experiencing any other symptoms? Light headiness and SOB when the events occurred.

## 2022-11-26 NOTE — Telephone Encounter (Signed)
Returned call to patient, no answer, unable to leave VM as number listed is business line, will send followup mychart message to patient.

## 2022-11-27 LAB — COMPREHENSIVE METABOLIC PANEL
ALT: 35 [IU]/L (ref 0–44)
AST: 28 [IU]/L (ref 0–40)
Albumin: 4.4 g/dL (ref 3.9–4.9)
Alkaline Phosphatase: 53 [IU]/L (ref 44–121)
BUN/Creatinine Ratio: 16 (ref 10–24)
BUN: 16 mg/dL (ref 8–27)
Bilirubin Total: 0.5 mg/dL (ref 0.0–1.2)
CO2: 23 mmol/L (ref 20–29)
Calcium: 9.3 mg/dL (ref 8.6–10.2)
Chloride: 103 mmol/L (ref 96–106)
Creatinine, Ser: 0.97 mg/dL (ref 0.76–1.27)
Globulin, Total: 3.1 g/dL (ref 1.5–4.5)
Glucose: 98 mg/dL (ref 70–99)
Potassium: 3.8 mmol/L (ref 3.5–5.2)
Sodium: 140 mmol/L (ref 134–144)
Total Protein: 7.5 g/dL (ref 6.0–8.5)
eGFR: 87 mL/min/{1.73_m2} (ref >59)

## 2022-11-27 LAB — CBC
Hematocrit: 44.5 % (ref 37.5–51.0)
Hemoglobin: 15.3 g/dL (ref 13.0–17.7)
MCH: 30.1 pg (ref 26.6–33.0)
MCHC: 34.4 g/dL (ref 31.5–35.7)
MCV: 87 fL (ref 79–97)
Platelets: 121 10*3/uL — ABNORMAL LOW (ref 150–450)
RBC: 5.09 x10E6/uL (ref 4.14–5.80)
RDW: 12.6 % (ref 11.6–15.4)
WBC: 4.2 10*3/uL (ref 3.4–10.8)

## 2022-11-27 LAB — LDL CHOLESTEROL, DIRECT: LDL Direct: 65 mg/dL (ref 0–99)

## 2022-11-27 LAB — TSH: TSH: 2.77 u[IU]/mL (ref 0.450–4.500)

## 2022-11-27 LAB — MAGNESIUM: Magnesium: 2.1 mg/dL (ref 1.6–2.3)

## 2022-12-05 DIAGNOSIS — C884 Extranodal marginal zone b-cell lymphoma of mucosa-associated lymphoid tissue (malt-lymphoma) not having achieved remission: Secondary | ICD-10-CM | POA: Diagnosis not present

## 2022-12-18 ENCOUNTER — Other Ambulatory Visit (HOSPITAL_BASED_OUTPATIENT_CLINIC_OR_DEPARTMENT_OTHER): Payer: Self-pay | Admitting: Family

## 2022-12-18 DIAGNOSIS — E785 Hyperlipidemia, unspecified: Secondary | ICD-10-CM

## 2022-12-18 DIAGNOSIS — I700 Atherosclerosis of aorta: Secondary | ICD-10-CM

## 2022-12-18 DIAGNOSIS — I2510 Atherosclerotic heart disease of native coronary artery without angina pectoris: Secondary | ICD-10-CM

## 2022-12-31 ENCOUNTER — Encounter (HOSPITAL_BASED_OUTPATIENT_CLINIC_OR_DEPARTMENT_OTHER): Payer: Self-pay | Admitting: Family

## 2022-12-31 ENCOUNTER — Ambulatory Visit (INDEPENDENT_AMBULATORY_CARE_PROVIDER_SITE_OTHER): Payer: BC Managed Care – PPO | Admitting: Family

## 2022-12-31 VITALS — BP 122/90 | HR 68 | Ht 70.0 in | Wt 248.1 lb

## 2022-12-31 DIAGNOSIS — R002 Palpitations: Secondary | ICD-10-CM | POA: Diagnosis not present

## 2022-12-31 DIAGNOSIS — I471 Supraventricular tachycardia, unspecified: Secondary | ICD-10-CM

## 2022-12-31 DIAGNOSIS — I25118 Atherosclerotic heart disease of native coronary artery with other forms of angina pectoris: Secondary | ICD-10-CM | POA: Diagnosis not present

## 2022-12-31 DIAGNOSIS — I700 Atherosclerosis of aorta: Secondary | ICD-10-CM

## 2022-12-31 DIAGNOSIS — E785 Hyperlipidemia, unspecified: Secondary | ICD-10-CM

## 2022-12-31 MED ORDER — PROPRANOLOL HCL 20 MG PO TABS: 20.0000 mg | ORAL_TABLET | ORAL | 2 refills | Status: DC | PRN

## 2022-12-31 NOTE — Progress Notes (Signed)
  Cardiology Office Note:  .   Date:  12/31/2022  ID:  Anthoney Keller, DOB Feb 10, 1960, MRN [id number] PCP: Sigmund Hazel, MD  [facility] Providers Cardiologist:  Chilton Si, MD    History of Present Illness: .   Garrett Keller is a 62 y.o. male  with a hx of coronary artery calcification on CT, hyperlipidemia, prior mild lymphoma, GERD.   Initially seen 2019 for shortness of breath.  Prior mild lymphoma.  Underwent resection of right parotid in 2003.  2017 had mass removed from his left neck and several lymph nodes removed.  No chemotherapy or radiation.  Chest CT noted atherosclerosis of the aorta and LAD.  ETT 12/2017 negative for ischemia.  Achieved 13 METS on Bruce protocol.   Seen 09/07/21 doing well from cardiac perspective with no changes made at that time.  Last in 11/26/2022 with palpitations.  CBC, TSH, magnesium, CMP were unremarkable.  14-day ZIO placed revealing predominantly normal sinus rhythm with brief episodes of SVT which were not associated with triggered episodes.  Presents today for follow up with improvement in palpitations.  Has reduced caffeine intake some.  Notes he stopped taking ZzzQuil which he attributes to improvement in palpitations.  Exercising regularly by walking and at the gym.  Reports no chest pain, exertional dyspnea, edema, orthopnea, PND.    ROS: Please see the history of present illness.    All other systems reviewed and are negative.   Studies Reviewed: .        Cardiac Studies & Procedures     STRESS TESTS  EXERCISE TOLERANCE TEST (ETT) 01/10/2018  Narrative  Clinically and electrically negative for ischemia  Excellent exercise tolerance              Risk Assessment/Calculations:            Physical Exam:   VS:  BP (!) 122/90   Pulse 68   Ht 5\' 10"  (1.778 m)   Wt 248 lb 1.6 oz (112.5 kg)   BMI 35.60 kg/m    Wt Readings from Last 3 Encounters:  12/31/22 248 lb 1.6 oz (112.5 kg)  11/26/22 248 lb 4.8 oz (112.6 kg)   09/07/21 240 lb 4.8 oz (109 kg)    GEN: Well nourished, well developed in no acute distress NECK: No JVD; No carotid bruits CARDIAC: RRR, no murmurs, rubs, gallops RESPIRATORY:  Clear to auscultation without rales, wheezing or rhonchi  ABDOMEN: Soft, non-tender, non-distended EXTREMITIES:  No edema; No deformity   ASSESSMENT AND PLAN: .    Palpitations / SVT-presently quiescent after stopping ZzzQuil and reducing intake of caffeine.  Monitor with brief episodes of SVT which were not associated with triggered episodes.  Reassurance provided.  Will Rx propranolol 20 mg as needed for breakthrough palpitations.    CAD / Aortic atherosclerosis / HLD, LDL goal <70 - Stable with no anginal symptoms. No indication for ischemic evaluation.  GDMT rosuvastatin, aspirin. No BB due to relative bradycardia.  Heart healthy diet and regular cardiovascular exercise encouraged. 12/04/2022 LDL 65.      Dispo: follow up in 1 year  Signed, Alver Sorrow, NP

## 2022-12-31 NOTE — Patient Instructions (Addendum)
Medication Instructions:   START Propranolol 20mg  as needed for palpitations.   *If you need a refill on your cardiac medications before your next appointment, please call your pharmacy*  Follow-Up: At [facility], you and your health needs are our priority.  As part of our continuing mission to provide you with exceptional heart care, we have created designated Provider Care Teams.  These Care Teams include your primary Cardiologist (physician) and Advanced Practice Providers (APPs -  Physician Assistants and Nurse Practitioners) who all work together to provide you with the care you need, when you need it.  We recommend signing up for the patient portal called "MyChart".  Sign up information is provided on this After Visit Summary.  MyChart is used to connect with patients for Virtual Visits (Telemedicine).  Patients are able to view lab/test results, encounter notes, upcoming appointments, etc.  Non-urgent messages can be sent to your provider as well.   To learn more about what you can do with MyChart, go to [email or web address].    Your next appointment:   1 year(s)  Provider:   Chilton Si, MD or Gillian Shields, NP    Other Instructions    Your monitor showed brief episodes of SVT (supraventricular tachycardia) this is a fast heart rhythm that occurs in the top chambers of the heart but are very short and not dangerous.  To prevent palpitations: Make sure you are adequately hydrated.  Avoid and/or limit caffeine containing beverages like soda or tea. Exercise regularly.  Manage stress well. Some over the counter medications can cause palpitations such as Benadryl, AdvilPM, TylenolPM. Regular Advil or Tylenol do not cause palpitations.

## 2023-01-25 ENCOUNTER — Other Ambulatory Visit (HOSPITAL_BASED_OUTPATIENT_CLINIC_OR_DEPARTMENT_OTHER): Payer: Self-pay | Admitting: Family

## 2023-01-25 DIAGNOSIS — R002 Palpitations: Secondary | ICD-10-CM

## 2023-01-30 ENCOUNTER — Other Ambulatory Visit (HOSPITAL_BASED_OUTPATIENT_CLINIC_OR_DEPARTMENT_OTHER): Payer: Self-pay | Admitting: Family

## 2023-01-30 DIAGNOSIS — R002 Palpitations: Secondary | ICD-10-CM

## 2023-02-15 DIAGNOSIS — G252 Other specified forms of tremor: Secondary | ICD-10-CM | POA: Diagnosis not present

## 2023-02-15 DIAGNOSIS — Z87828 Personal history of other (healed) physical injury and trauma: Secondary | ICD-10-CM | POA: Diagnosis not present

## 2023-02-19 DIAGNOSIS — C884 Extranodal marginal zone b-cell lymphoma of mucosa-associated lymphoid tissue (malt-lymphoma) not having achieved remission: Secondary | ICD-10-CM | POA: Diagnosis not present

## 2023-04-09 DIAGNOSIS — L814 Other melanin hyperpigmentation: Secondary | ICD-10-CM | POA: Diagnosis not present

## 2023-04-09 DIAGNOSIS — D2271 Melanocytic nevi of right lower limb, including hip: Secondary | ICD-10-CM | POA: Diagnosis not present

## 2023-04-09 DIAGNOSIS — L538 Other specified erythematous conditions: Secondary | ICD-10-CM | POA: Diagnosis not present

## 2023-04-09 DIAGNOSIS — L2989 Other pruritus: Secondary | ICD-10-CM | POA: Diagnosis not present

## 2023-04-09 DIAGNOSIS — D2272 Melanocytic nevi of left lower limb, including hip: Secondary | ICD-10-CM | POA: Diagnosis not present

## 2023-04-09 DIAGNOSIS — R208 Other disturbances of skin sensation: Secondary | ICD-10-CM | POA: Diagnosis not present

## 2023-04-09 DIAGNOSIS — L820 Inflamed seborrheic keratosis: Secondary | ICD-10-CM | POA: Diagnosis not present

## 2023-05-15 ENCOUNTER — Other Ambulatory Visit (HOSPITAL_BASED_OUTPATIENT_CLINIC_OR_DEPARTMENT_OTHER): Payer: Self-pay | Admitting: *Deleted

## 2023-05-15 DIAGNOSIS — R002 Palpitations: Secondary | ICD-10-CM

## 2023-05-15 DIAGNOSIS — I700 Atherosclerosis of aorta: Secondary | ICD-10-CM

## 2023-05-15 DIAGNOSIS — E785 Hyperlipidemia, unspecified: Secondary | ICD-10-CM

## 2023-05-15 DIAGNOSIS — I2510 Atherosclerotic heart disease of native coronary artery without angina pectoris: Secondary | ICD-10-CM

## 2023-05-15 MED ORDER — PROPRANOLOL HCL 20 MG PO TABS: 20.0000 mg | ORAL_TABLET | ORAL | 2 refills | Status: AC | PRN

## 2023-05-15 MED ORDER — ROSUVASTATIN CALCIUM 40 MG PO TABS: 40.0000 mg | ORAL_TABLET | Freq: Every day | ORAL | 2 refills | Status: DC

## 2023-12-22 ENCOUNTER — Other Ambulatory Visit (HOSPITAL_BASED_OUTPATIENT_CLINIC_OR_DEPARTMENT_OTHER): Payer: Self-pay | Admitting: Family

## 2023-12-22 DIAGNOSIS — E785 Hyperlipidemia, unspecified: Secondary | ICD-10-CM

## 2023-12-22 DIAGNOSIS — I700 Atherosclerosis of aorta: Secondary | ICD-10-CM

## 2023-12-22 DIAGNOSIS — I2510 Atherosclerotic heart disease of native coronary artery without angina pectoris: Secondary | ICD-10-CM
